# Patient Record
Sex: Female | Born: 1980
Health system: Southern US, Community
[De-identification: ages and names within clinical notes are randomized; demographics above are authoritative.]

## PROBLEM LIST (undated history)

## (undated) HISTORY — PX: APPENDECTOMY: SHX54

## (undated) HISTORY — PX: CYST EXCISION: SHX5701

## (undated) HISTORY — PX: CHOLECYSTECTOMY: SHX55

---

## 2013-03-28 ENCOUNTER — Ambulatory Visit: Payer: Self-pay | Admitting: Family Medicine

## 2013-06-16 ENCOUNTER — Inpatient Hospital Stay: Payer: Self-pay | Admitting: Obstetrics and Gynecology

## 2013-06-16 ENCOUNTER — Observation Stay: Payer: Self-pay | Admitting: Obstetrics and Gynecology

## 2013-06-16 LAB — CBC WITH DIFFERENTIAL/PLATELET
Basophil %: 0.7 %
Eosinophil #: 0.2 10*3/uL (ref 0.0–0.7)
Eosinophil %: 2.5 %
HCT: 33.2 % — ABNORMAL LOW (ref 35.0–47.0)
Lymphocyte #: 1.4 10*3/uL (ref 1.0–3.6)
Lymphocyte %: 17.5 %
MCV: 95 fL (ref 80–100)
Monocyte %: 7.2 %
Neutrophil #: 5.9 10*3/uL (ref 1.4–6.5)
Neutrophil %: 72.1 %
Platelet: 267 10*3/uL (ref 150–440)
RBC: 3.51 10*6/uL — ABNORMAL LOW (ref 3.80–5.20)
RDW: 13.1 % (ref 11.5–14.5)
WBC: 8.2 10*3/uL (ref 3.6–11.0)

## 2013-06-16 LAB — URINALYSIS, COMPLETE
Bacteria: NONE SEEN
Bilirubin,UR: NEGATIVE
Glucose,UR: NEGATIVE mg/dL (ref 0–75)
Ketone: NEGATIVE
Leukocyte Esterase: NEGATIVE
Ph: 7 (ref 4.5–8.0)
Protein: NEGATIVE
Squamous Epithelial: <1

## 2013-06-16 LAB — RUPTURE OF MEMBRANE PLUS: Rom Plus: DETECTED

## 2013-06-18 LAB — CBC WITH DIFFERENTIAL/PLATELET
Basophil %: 0.2 %
Eosinophil #: 0 10*3/uL (ref 0.0–0.7)
Eosinophil %: 0.1 %
HGB: 11.5 g/dL — ABNORMAL LOW (ref 12.0–16.0)
Lymphocyte #: 1.3 10*3/uL (ref 1.0–3.6)
Lymphocyte %: 12.4 %
MCH: 33.1 pg (ref 26.0–34.0)
MCHC: 34.9 g/dL (ref 32.0–36.0)
MCV: 95 fL (ref 80–100)
Monocyte #: 0.6 x10 3/mm (ref 0.2–0.9)
Monocyte %: 5.7 %
Neutrophil #: 8.6 10*3/uL — ABNORMAL HIGH (ref 1.4–6.5)
Neutrophil %: 81.6 %
RBC: 3.47 10*6/uL — ABNORMAL LOW (ref 3.80–5.20)
RDW: 12.8 % (ref 11.5–14.5)

## 2013-06-19 LAB — CBC WITH DIFFERENTIAL/PLATELET
Basophil %: 0.6 %
Eosinophil #: 0.1 10*3/uL (ref 0.0–0.7)
Lymphocyte #: 2.1 10*3/uL (ref 1.0–3.6)
Lymphocyte %: 21.5 %
MCHC: 35.2 g/dL (ref 32.0–36.0)
MCV: 94 fL (ref 80–100)
Monocyte #: 0.9 x10 3/mm (ref 0.2–0.9)
Monocyte %: 8.8 %
Neutrophil #: 6.6 10*3/uL — ABNORMAL HIGH (ref 1.4–6.5)
WBC: 9.7 10*3/uL (ref 3.6–11.0)

## 2013-06-20 LAB — CBC WITH DIFFERENTIAL/PLATELET
Basophil #: 0 10*3/uL (ref 0.0–0.1)
Basophil %: 0.5 %
Eosinophil %: 2.4 %
HCT: 31.5 % — ABNORMAL LOW (ref 35.0–47.0)
HGB: 10.8 g/dL — ABNORMAL LOW (ref 12.0–16.0)
Lymphocyte #: 2.1 10*3/uL (ref 1.0–3.6)
Lymphocyte %: 21.7 %
MCH: 32.6 pg (ref 26.0–34.0)
MCV: 95 fL (ref 80–100)
Monocyte %: 8.8 %
Platelet: 254 10*3/uL (ref 150–440)
RBC: 3.32 10*6/uL — ABNORMAL LOW (ref 3.80–5.20)
WBC: 9.8 10*3/uL (ref 3.6–11.0)

## 2013-06-21 LAB — CBC WITH DIFFERENTIAL/PLATELET
Basophil #: 0.1 10*3/uL (ref 0.0–0.1)
Basophil #: 0.1 10*3/uL (ref 0.0–0.1)
Basophil %: 0.8 %
Eosinophil #: 0.3 10*3/uL (ref 0.0–0.7)
Eosinophil #: 0.3 10*3/uL (ref 0.0–0.7)
Eosinophil %: 2.9 %
HCT: 33.6 % — ABNORMAL LOW (ref 35.0–47.0)
Lymphocyte #: 2.1 10*3/uL (ref 1.0–3.6)
Lymphocyte %: 19 %
Lymphocyte %: 17.6 %
MCH: 32.8 pg (ref 26.0–34.0)
MCH: 32.4 pg (ref 26.0–34.0)
MCHC: 34.6 g/dL (ref 32.0–36.0)
MCV: 94 fL (ref 80–100)
Monocyte #: 1 x10 3/mm — ABNORMAL HIGH (ref 0.2–0.9)
Monocyte %: 9.5 %
Neutrophil #: 7.5 10*3/uL — ABNORMAL HIGH (ref 1.4–6.5)
Neutrophil %: 67.9 %
Neutrophil %: 69.3 %
RBC: 3.63 10*6/uL — ABNORMAL LOW (ref 3.80–5.20)
RDW: 12.6 % (ref 11.5–14.5)
RDW: 12.8 % (ref 11.5–14.5)
WBC: 11.1 10*3/uL — ABNORMAL HIGH (ref 3.6–11.0)

## 2013-06-22 LAB — CBC WITH DIFFERENTIAL/PLATELET
Eosinophil %: 3.3 %
HGB: 12.1 g/dL (ref 12.0–16.0)
Lymphocyte #: 2 10*3/uL (ref 1.0–3.6)
Lymphocyte %: 18 %
Monocyte #: 1 x10 3/mm — ABNORMAL HIGH (ref 0.2–0.9)
Neutrophil #: 7.6 10*3/uL — ABNORMAL HIGH (ref 1.4–6.5)
Neutrophil %: 69.3 %
RDW: 12.6 % (ref 11.5–14.5)
WBC: 10.9 10*3/uL (ref 3.6–11.0)

## 2013-06-23 LAB — CBC WITH DIFFERENTIAL/PLATELET
Basophil #: 0.1 10*3/uL (ref 0.0–0.1)
Basophil %: 0.8 %
Eosinophil %: 4.7 %
HCT: 33.1 % — ABNORMAL LOW (ref 35.0–47.0)
Lymphocyte #: 2.1 10*3/uL (ref 1.0–3.6)
Lymphocyte %: 21.6 %
MCH: 33.1 pg (ref 26.0–34.0)
MCHC: 35.2 g/dL (ref 32.0–36.0)
MCV: 94 fL (ref 80–100)
Monocyte #: 0.9 x10 3/mm (ref 0.2–0.9)
Neutrophil #: 6.1 10*3/uL (ref 1.4–6.5)
Neutrophil %: 63.8 %
Platelet: 268 10*3/uL (ref 150–440)
WBC: 9.6 10*3/uL (ref 3.6–11.0)

## 2013-06-24 LAB — CBC WITH DIFFERENTIAL/PLATELET
Basophil #: 0.1 10*3/uL (ref 0.0–0.1)
Basophil %: 0.9 %
Eosinophil #: 0.4 10*3/uL (ref 0.0–0.7)
Eosinophil %: 4.2 %
HCT: 33.3 % — ABNORMAL LOW (ref 35.0–47.0)
HGB: 11.6 g/dL — ABNORMAL LOW (ref 12.0–16.0)
Lymphocyte #: 2.1 10*3/uL (ref 1.0–3.6)
Lymphocyte %: 22 %
MCH: 32.9 pg (ref 26.0–34.0)
MCV: 95 fL (ref 80–100)
Monocyte #: 0.9 x10 3/mm (ref 0.2–0.9)
Monocyte %: 9.1 %
Neutrophil %: 63.8 %
RDW: 12.5 % (ref 11.5–14.5)
WBC: 9.5 10*3/uL (ref 3.6–11.0)

## 2013-06-25 LAB — CBC WITH DIFFERENTIAL/PLATELET
Basophil #: 0.1 10*3/uL (ref 0.0–0.1)
Basophil %: 0.9 %
Eosinophil #: 0.5 10*3/uL (ref 0.0–0.7)
Eosinophil %: 4.8 %
HCT: 31.9 % — ABNORMAL LOW (ref 35.0–47.0)
HGB: 11.1 g/dL — ABNORMAL LOW (ref 12.0–16.0)
Lymphocyte #: 2.2 10*3/uL (ref 1.0–3.6)
Lymphocyte %: 23.1 %
MCH: 32.7 pg (ref 26.0–34.0)
MCHC: 34.7 g/dL (ref 32.0–36.0)
MCV: 94 fL (ref 80–100)
Monocyte %: 8.3 %
Neutrophil #: 6 10*3/uL (ref 1.4–6.5)
Platelet: 245 10*3/uL (ref 150–440)
RDW: 12.7 % (ref 11.5–14.5)
WBC: 9.6 10*3/uL (ref 3.6–11.0)

## 2013-06-26 LAB — CBC WITH DIFFERENTIAL/PLATELET
Basophil #: 0.1 10*3/uL (ref 0.0–0.1)
Eosinophil %: 3.5 %
HCT: 31.2 % — ABNORMAL LOW (ref 35.0–47.0)
HGB: 11.1 g/dL — ABNORMAL LOW (ref 12.0–16.0)
Lymphocyte #: 2 10*3/uL (ref 1.0–3.6)
Lymphocyte %: 20.9 %
MCH: 33.1 pg (ref 26.0–34.0)
MCHC: 35.4 g/dL (ref 32.0–36.0)
MCV: 94 fL (ref 80–100)
Monocyte #: 0.7 x10 3/mm (ref 0.2–0.9)
Monocyte %: 7.7 %
Platelet: 249 10*3/uL (ref 150–440)
RDW: 12.4 % (ref 11.5–14.5)

## 2013-06-27 LAB — CBC WITH DIFFERENTIAL/PLATELET
Basophil #: 0.1 10*3/uL (ref 0.0–0.1)
Eosinophil #: 0.3 10*3/uL (ref 0.0–0.7)
Eosinophil %: 3.5 %
HGB: 11.4 g/dL — ABNORMAL LOW (ref 12.0–16.0)
Lymphocyte #: 1.8 10*3/uL (ref 1.0–3.6)
Lymphocyte %: 19.1 %
MCV: 94 fL (ref 80–100)
Monocyte %: 8.5 %
Neutrophil %: 67.8 %
Platelet: 230 10*3/uL (ref 150–440)
RBC: 3.42 10*6/uL — ABNORMAL LOW (ref 3.80–5.20)
RDW: 12.6 % (ref 11.5–14.5)
WBC: 9.6 10*3/uL (ref 3.6–11.0)

## 2013-06-29 LAB — CBC WITH DIFFERENTIAL/PLATELET
Basophil %: 0.2 %
HGB: 10.9 g/dL — ABNORMAL LOW (ref 12.0–16.0)
Lymphocyte %: 8 %
MCH: 32.5 pg (ref 26.0–34.0)
MCHC: 34.6 g/dL (ref 32.0–36.0)
MCV: 94 fL (ref 80–100)
Monocyte %: 6.6 %
Neutrophil #: 20.7 10*3/uL — ABNORMAL HIGH (ref 1.4–6.5)
RDW: 12.7 % (ref 11.5–14.5)

## 2013-06-30 LAB — CBC WITH DIFFERENTIAL/PLATELET
Basophil #: 0.1 10*3/uL (ref 0.0–0.1)
Eosinophil %: 2.9 %
Lymphocyte #: 3.2 10*3/uL (ref 1.0–3.6)
Lymphocyte %: 27.3 %
MCHC: 34.5 g/dL (ref 32.0–36.0)
Monocyte #: 0.9 x10 3/mm (ref 0.2–0.9)
Monocyte %: 7.9 %
Neutrophil %: 61.2 %
Platelet: 201 10*3/uL (ref 150–440)
RBC: 3.31 10*6/uL — ABNORMAL LOW (ref 3.80–5.20)

## 2013-07-02 LAB — PATHOLOGY REPORT

## 2015-01-10 NOTE — Consult Note (Signed)
   Gravida 3   Para 2   Term Deliveries 2   Preterm Deliveries 0   Abortions 0   Living Children 2   Final EDD (dd-[id number]) 11-Aug-2013   Blood Type (Maternal) O positive   Antibody Screen Results (Maternal) negative   HIV Results (Maternal) negative   Gonorrhea Results (Maternal) negative   Chlamydia Results (Maternal) negative   Hepatitis C Culture (Maternal) unknown   Herpes Results (Maternal) n/a   VDRL/RPR/Syphilis Results (Maternal) negative   Varicella Titer Results (Maternal) Positive   Rubella Results (Maternal) immune   Hepatitis B Surface Antigen Results (Maternal) negative   Group B Strep Results Maternal (Result >5wks must be treated as unknown) unknown/result > 5 weeks ago  just sent swab on admission 06/16/13    Additional Comments Asked by Dr. Feliberto GottronSchermerhorn to provide prenatal consultation for this 34 y.o  G3 P2 mother who has presented with PROM at 32 wks.  She is being treated with BMZ, Magnesium sulfate and ampicillin, with plans to defer delivery for 2 - days unless there are signs of infection or other complications.  Talked with patient and FOB about NICU attendance at delivery and possible need for resuscitation, and about usual expectations for preterm infants at 32+ wks, including possible respiratory distress requiring O2 or respiratory support, hypothermia, feeding problems, and need for IV support.  Presented uncertain LOS, possibly 3 - 4 wks or until 36 - [redacted] wks EGA.  Discussed desirability of feeding mother's milk - she plans to pump and breast feed ASAP after delivery.  Patient and FOB were attentive and asked appropriate questions and expressed appreciation for my input.  Thank you for consulting Neonatology  JWimmer, MD  face-to-face time 20 minutes   Electronic Signatures: Serita GritWimmer, Delia Sitar E (MD)  (Signed 27-Sep-14 14:35)  Authored: PREGNANCY and LABOR, ADDITIONAL COMMENTS   Last Updated: 27-Sep-14 14:35 by Serita GritWimmer, Doryan Bahl E (MD)

## 2015-01-28 NOTE — H&P (Signed)
L&D Evaluation:  History:  HPI [age] yo G3P2 at 32+0 Weeks  c/o leakage of fluid since yesterday . + Rom plus today . + bloody/ pinkish fluid mixed in . No CTX . 2 prior term deliveries   Patient's Medical History No Chronic Illness   Patient's Surgical History none   Medications Pre Natal Vitamins   Allergies NKDA   Social History none   Family History Non-Contributory   ROS:  ROS All systems were reviewed.  HEENT, CNS, GI, GU, Respiratory, CV, Renal and Musculoskeletal systems were found to be normal.   Exam:  Vital Signs 110/64   General no apparent distress   Mental Status clear   Chest clear   Heart normal sinus rhythm   Abdomen gravid, non-tender   Estimated Fetal Weight Average for gestational age   Fetal Position vtx by U/s   Back no CVAT   Pelvic closed / long Vtx blt   Mebranes Ruptured   Description blood tinged   FHT normal rate with no decels   Fetal Heart Rate 150   Ucx absent   Skin dry   Other u/s by me  AFI = 5.8 cm VTX   Impression:  Impression PPROM, 32 + 0 weeks   Plan:  Plan admission. Start magnesium sulfate 4 gm bolus and 2 gm/ hr for neuroprotective and ctx suppression for betamethasone effectiveness. Start ampicillin 2 gm bolus and 1 gm q 4 hrs. neonatal consult in the event of  delivery.   Electronic Signatures: Schermerhorn, Ihor Austinhomas J (MD)  (Signed 27-Sep-14 11:43)  Authored: L&D Evaluation   Last Updated: 27-Sep-14 11:43 by Suzy BouchardSchermerhorn, Thomas J (MD)

## 2016-04-18 ENCOUNTER — Encounter: Payer: Self-pay | Admitting: Emergency Medicine

## 2016-04-18 ENCOUNTER — Emergency Department
Admission: EM | Admit: 2016-04-18 | Discharge: 2016-04-18 | Disposition: A | Discharge status: Home / Self Care | Payer: Self-pay | Attending: Emergency Medicine | Admitting: Emergency Medicine

## 2016-04-18 ENCOUNTER — Emergency Department: Payer: Self-pay

## 2016-04-18 DIAGNOSIS — Y990 Civilian activity done for income or pay: Secondary | ICD-10-CM | POA: Insufficient documentation

## 2016-04-18 DIAGNOSIS — S5002XA Contusion of left elbow, initial encounter: Secondary | ICD-10-CM | POA: Insufficient documentation

## 2016-04-18 DIAGNOSIS — S50312A Abrasion of left elbow, initial encounter: Secondary | ICD-10-CM

## 2016-04-18 DIAGNOSIS — M7918 Myalgia, other site: Secondary | ICD-10-CM

## 2016-04-18 DIAGNOSIS — Y92488 Other paved roadways as the place of occurrence of the external cause: Secondary | ICD-10-CM | POA: Insufficient documentation

## 2016-04-18 DIAGNOSIS — S0101XA Laceration without foreign body of scalp, initial encounter: Secondary | ICD-10-CM | POA: Insufficient documentation

## 2016-04-18 DIAGNOSIS — Y9389 Activity, other specified: Secondary | ICD-10-CM | POA: Insufficient documentation

## 2016-04-18 DIAGNOSIS — S0093XA Contusion of unspecified part of head, initial encounter: Secondary | ICD-10-CM

## 2016-04-18 DIAGNOSIS — W1800XA Striking against unspecified object with subsequent fall, initial encounter: Secondary | ICD-10-CM | POA: Insufficient documentation

## 2016-04-18 MED ORDER — IBUPROFEN 800 MG PO TABS: 800.0000 mg | ORAL_TABLET | Freq: Three times a day (TID) | ORAL | 0 refills | Status: DC | PRN

## 2016-04-18 MED ORDER — BUTALBITAL-APAP-CAFFEINE 50-325-40 MG PO TABS
1.0000 | ORAL_TABLET | Freq: Four times a day (QID) | ORAL | 0 refills | Status: DC | PRN
Start: 2016-04-18 — End: 2019-11-02

## 2016-04-18 MED ORDER — BACITRACIN ZINC 500 UNIT/GM EX OINT
TOPICAL_OINTMENT | Freq: Once | CUTANEOUS | Status: AC
  Administered 2016-04-18: 12:00:00 via TOPICAL
  Filled 2016-04-18: qty 0.9

## 2016-04-18 MED ORDER — BUTALBITAL-APAP-CAFFEINE 50-325-40 MG PO TABS
1.0000 | ORAL_TABLET | Freq: Once | ORAL | Status: AC
  Administered 2016-04-18: 1 via ORAL
  Filled 2016-04-18: qty 1
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
## 2016-04-18 NOTE — ED Notes (Signed)
See triage note  States she fell hit head  Small laceration/abrasion to scalp and  To elbow  Denies any LOC
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
## 2016-04-18 NOTE — ED Notes (Signed)
Pt informed to return if any life threatening symptoms occur.  

## 2016-04-30 ENCOUNTER — Emergency Department
Admission: EM | Admit: 2016-04-30 | Discharge: 2016-04-30 | Disposition: A | Discharge status: Home / Self Care | Payer: Self-pay | Attending: Emergency Medicine | Admitting: Emergency Medicine

## 2016-04-30 ENCOUNTER — Encounter: Payer: Self-pay | Admitting: Emergency Medicine

## 2016-04-30 DIAGNOSIS — Z4802 Encounter for removal of sutures: Secondary | ICD-10-CM | POA: Insufficient documentation

## 2016-04-30 DIAGNOSIS — Z791 Long term (current) use of non-steroidal anti-inflammatories (NSAID): Secondary | ICD-10-CM | POA: Insufficient documentation

## 2016-04-30 NOTE — ED Provider Notes (Signed)
[facility] Emergency Department Provider Note  ____________________________________________  Time seen: Approximately 4:23 PM  I have reviewed the triage vital signs and the nursing notes.   HISTORY  Chief Complaint Suture / Staple Removal    HPI Mary Conner is a 35 y.o. female who presents to emergency department for staple removal from the back of her head. Patient states thatareas healing well with no complications. Patient was seen in this department and had 4 staples placed. She reports all staples are still intact. No complaints at this time.   History reviewed. No pertinent past medical history.  There are no active problems to display for this patient.   History reviewed. No pertinent surgical history.  Prior to Admission medications   Medication Sig Start Date End Date Taking? Authorizing Provider  butalbital-acetaminophen-caffeine (FIORICET) 50-325-40 MG tablet Take 1-2 tablets by mouth every 6 (six) hours as needed for headache. 04/18/16   Evangeline Dakinharles M Beers, PA-C  ibuprofen (ADVIL,MOTRIN) 800 MG tablet Take 1 tablet (800 mg total) by mouth every 8 (eight) hours as needed. 04/18/16   Evangeline Dakinharles M Beers, PA-C    Allergies Review of patient's allergies indicates no known allergies.  History reviewed. No pertinent family history.  Social History Social History  Substance Use Topics  . Smoking status: Never Smoker  . Smokeless tobacco: Never Used  . Alcohol use Yes     Comment: occasionally     Review of Systems  Constitutional: No fever/chills Cardiovascular: no chest pain. Respiratory: no cough. No SOB. Gastrointestinal:  No nausea, no vomiting.   Musculoskeletal: Negative for musculoskeletal pain. Skin: Positive for staple laceration to the  left occipital region Neurological: Negative for headaches, focal weakness or numbness. 10-point ROS otherwise negative.  ____________________________________________   PHYSICAL  EXAM:  VITAL SIGNS: ED Triage Vitals  Enc Vitals Group     BP 04/30/16 1613 (!) 143/90     Pulse Rate 04/30/16 1613 83     Resp 04/30/16 1613 16     Temp 04/30/16 1613 97.8 F (36.6 C)     Temp Source 04/30/16 1613 Oral     SpO2 04/30/16 1613 100 %     Weight 04/30/16 1612 212 lb (96.2 kg)     Height 04/30/16 1612 5\' 4"  (1.626 m)     Head Circumference --      Peak Flow --      Pain Score --      Pain Loc --      Pain Edu? --      Excl. in GC? --      Constitutional: Alert and oriented. Well appearing and in no acute distress. Eyes: Conjunctivae are normal. PERRL. EOMI. Head: Stapled laceration noted to the left posterior occipital region. 4 staples are in place. No dehiscence noted. No erythema or edema to area. No signs of infection. Area is nontender to palpation. Cardiovascular: Normal rate, regular rhythm. Normal S1 and S2.  Good peripheral circulation. Respiratory: Normal respiratory effort without tachypnea or retractions. Lungs CTAB. Good air entry to the bases with no decreased or absent breath sounds. Musculoskeletal: Full range of motion to all extremities. No gross deformities appreciated. Neurologic:  Normal speech and language. No gross focal neurologic deficits are appreciated.  Skin:  Skin is warm, dry and intact. No rash noted. Psychiatric: Mood and affect are normal. Speech and behavior are normal. Patient exhibits appropriate insight and judgement.   ____________________________________________   LABS (all labs ordered are listed, but only abnormal results  are displayed)  Labs Reviewed - No data to display ____________________________________________  EKG   ____________________________________________  RADIOLOGY   No results found.  ____________________________________________    PROCEDURES  Procedure(s) performed:    Procedures  SUTURE REMOVAL Performed by: Racheal Patches  Consent: Verbal consent obtained. Consent given by:  patient Required items: required blood products, implants, devices, and special equipment available Time out: Immediately prior to procedure a "time out" was called to verify the correct patient, procedure, equipment, support staff and site/side marked as required.  Location: Left occipital skull  Wound Appearance: clean  Sutures/Staples Removed: 4  Patient tolerance: Patient tolerated the procedure well with no immediate complications.     Medications - No data to display   ____________________________________________   INITIAL IMPRESSION / ASSESSMENT AND PLAN / ED COURSE  Pertinent labs & imaging results that were available during my care of the patient were reviewed by me and considered in my medical decision making (see chart for details).  Clinical Course    Patient's diagnosis is consistent with Encounter for staple removal. No signs of infection or dehiscence. Wound has healed well. All staples are removed.. Patient will follow-up with primary care as needed Patient is given ED precautions to return to the ED for any worsening or new symptoms.     ____________________________________________  FINAL CLINICAL IMPRESSION(S) / ED DIAGNOSES  Final diagnoses:  Encounter for staple removal      NEW MEDICATIONS STARTED DURING THIS VISIT:  New Prescriptions   No medications on file        This chart was dictated using voice recognition software/Dragon. Despite best efforts to proofread, errors can occur which can change the meaning. Any change was purely unintentional.    Racheal Patches, PA-C 04/30/16 1628    Myrna Blazer, MD 04/30/16 2107

## 2016-04-30 NOTE — ED Triage Notes (Signed)
Pt has staples placed 12 days ago. Wants to see if they can be removed.

## 2017-09-12 IMAGING — CT CT HEAD W/O CM
3 series · 15 of 45 positions shown, 18 images · non-contrast
Comparison: None.

CLINICAL DATA: Headache and left ear pain status post fall with
laceration to the posterior scalp.

EXAM:
CT HEAD WITHOUT CONTRAST
TECHNIQUE: Contiguous axial images were obtained from the base of the skull
through the vertex without intravenous contrast.

[Series 2: head wo · axial · 0.39mm/px · z∈[-172,-56]mm · 9 of 28 slices shown, 12 images]
[im 3/28  brain]
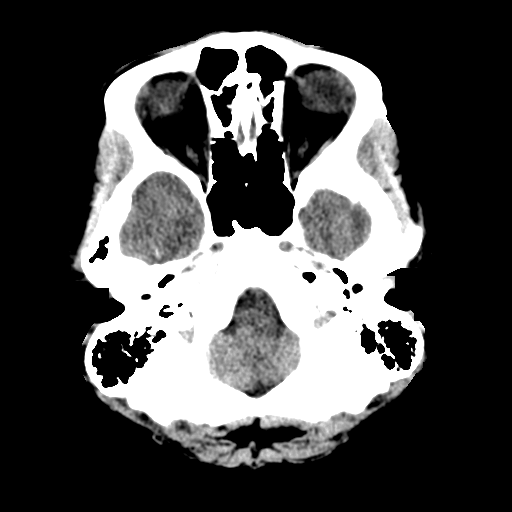
[im 3/28  bone]
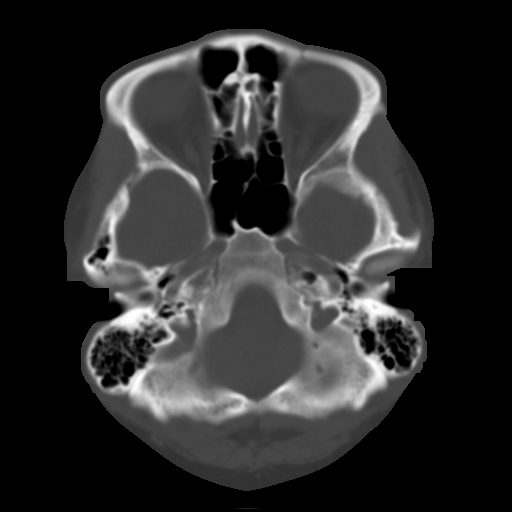
[im 6/28  brain]
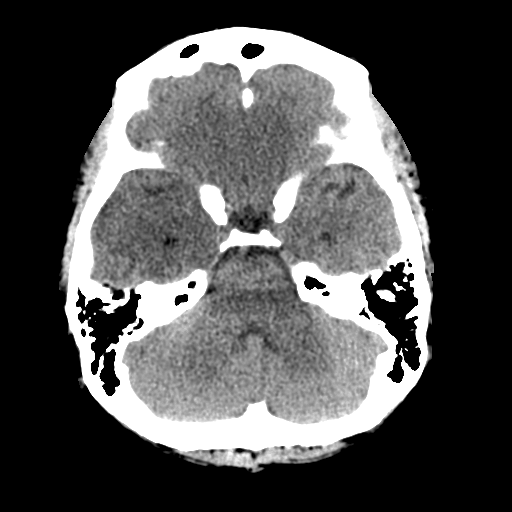
[im 9/28  brain]
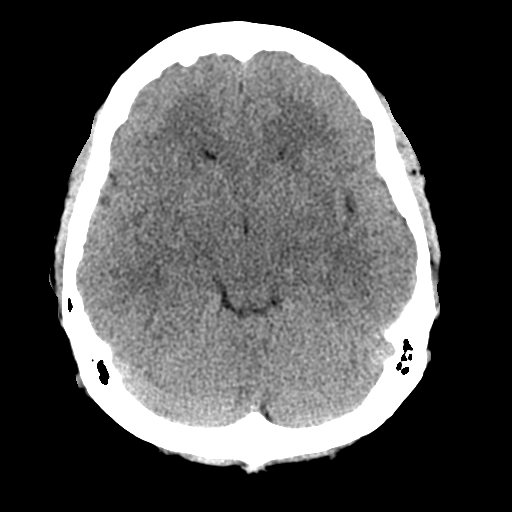
[im 12/28  brain]
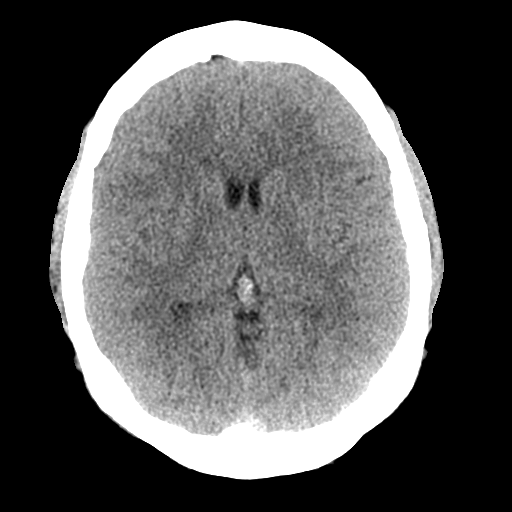
[im 15/28  brain]
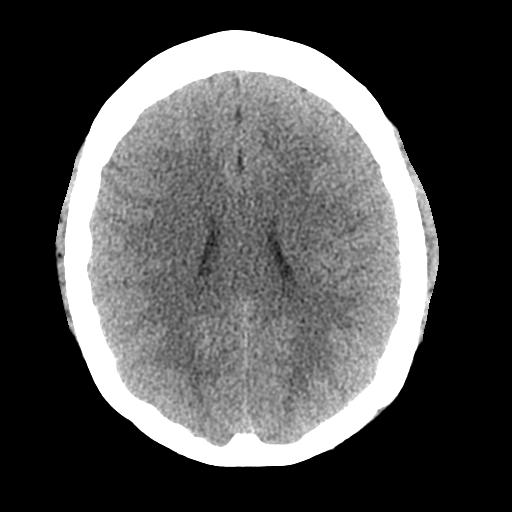
[im 15/28  bone]
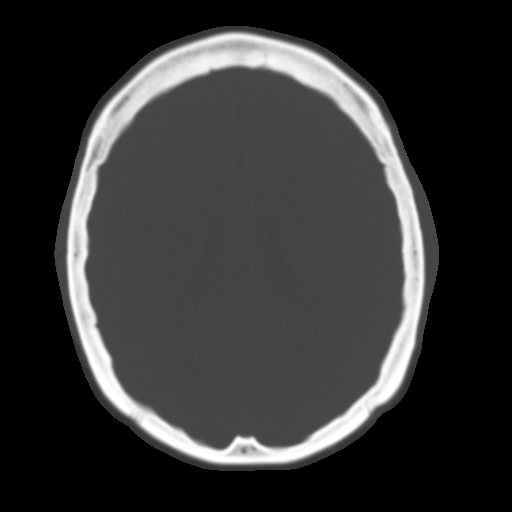
[im 17/28  brain]
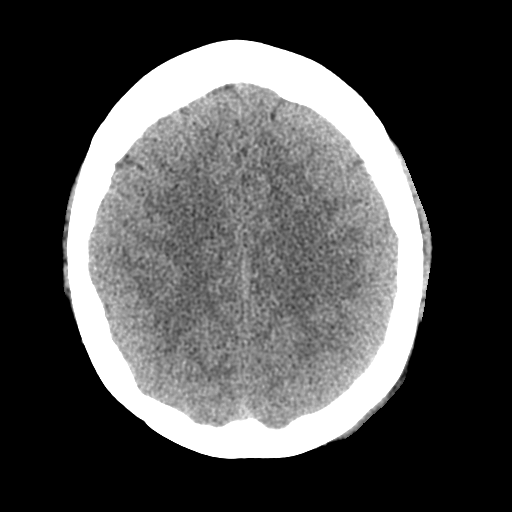
[im 20/28  brain]
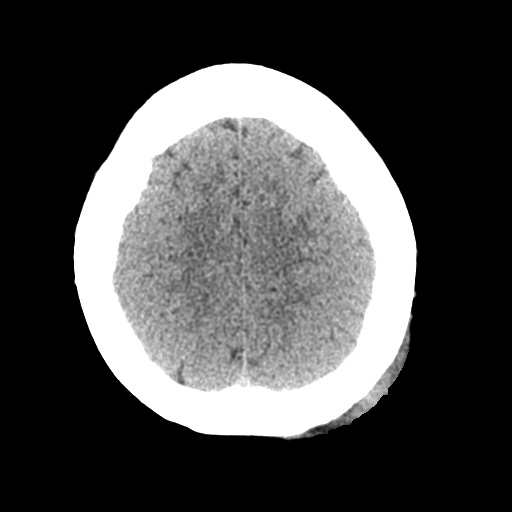
[im 23/28  brain]
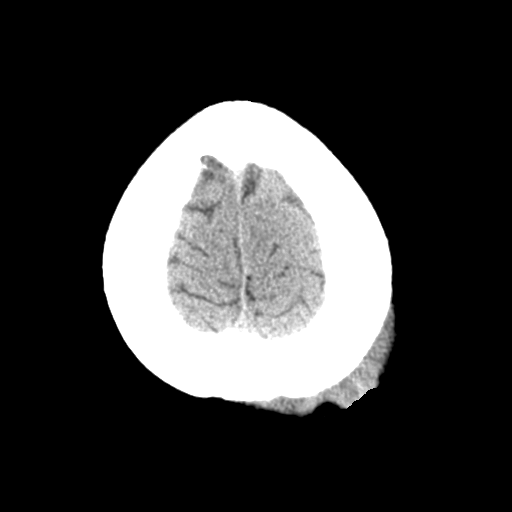
[im 26/28  brain]
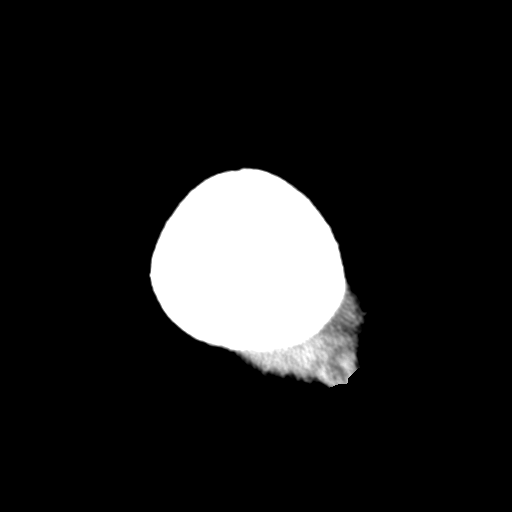
[im 26/28  bone]
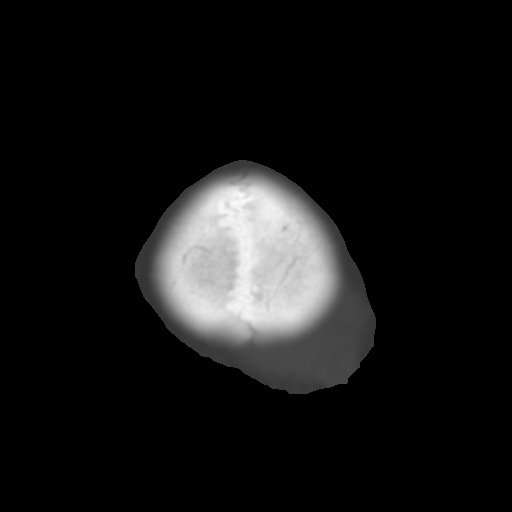

[Series 4: coronal soft tissue · coronal · 0.28mm/px · 3 of 66 slices shown]
[im 22/66  brain]
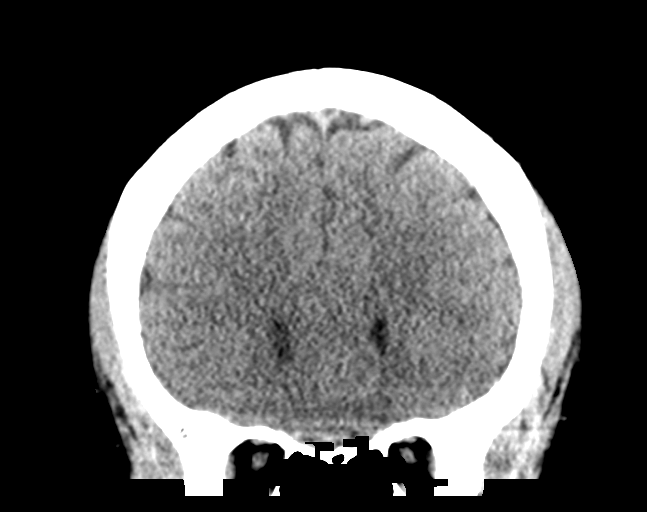
[im 29/66  brain]
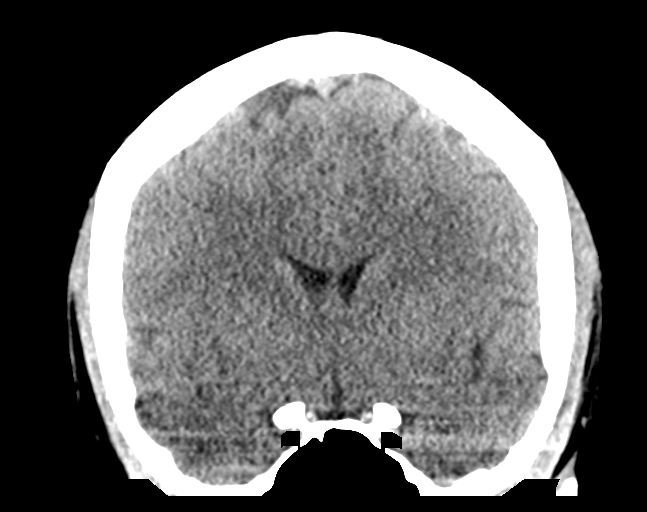
[im 37/66  brain]
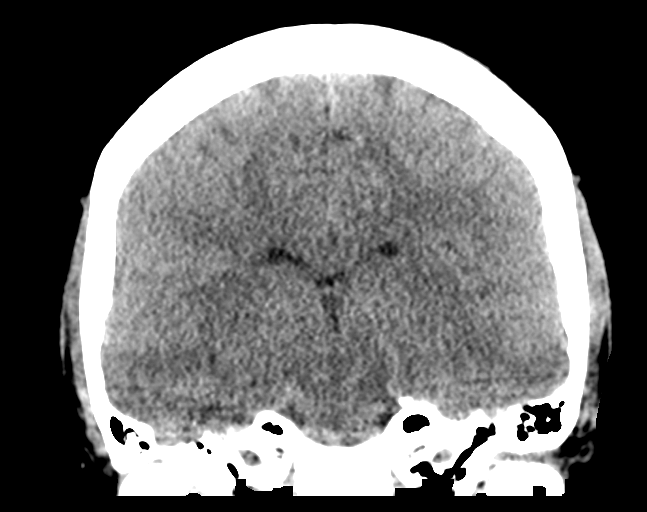

[Series 5: sagittal soft tissue · sagittal · 0.29mm/px · 3 of 61 slices shown]
[im 21/61  brain]
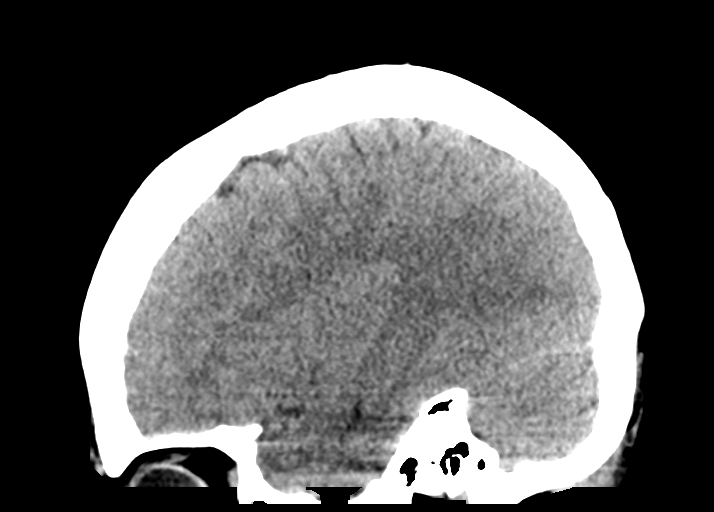
[im 31/61  brain]
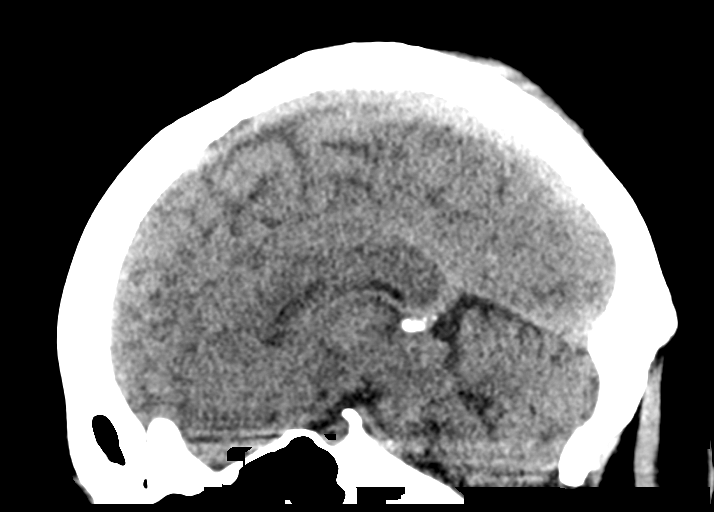
[im 41/61  brain]
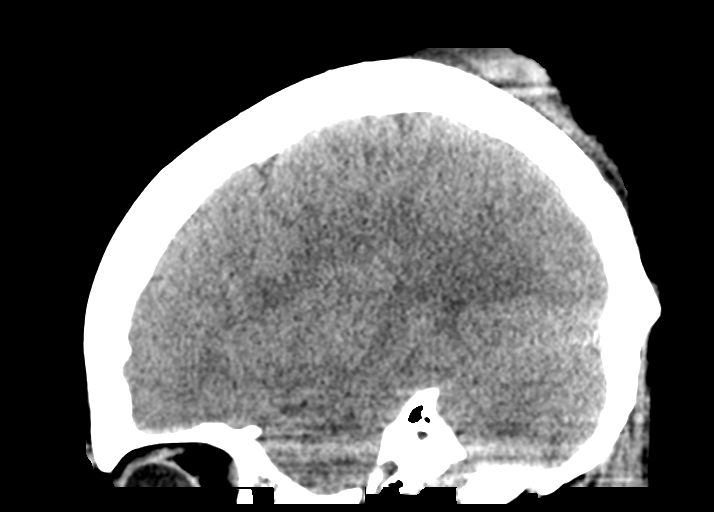

[15 of 45 positions shown; findings below may reference images not displayed]

FINDINGS: Brain: No evidence of acute infarction, hemorrhage, extra-axial
collection, ventriculomegaly, or mass effect.

Vascular: No hyperdense vessel or unexpected calcification.

Skull: Negative for fracture or focal lesion.

Sinuses/Orbits: No acute findings.

Other: Large left parietal scalp hematoma.
IMPRESSION: No acute intracranial abnormality.

## 2019-11-02 ENCOUNTER — Encounter: Payer: Self-pay | Admitting: Family

## 2019-11-02 ENCOUNTER — Other Ambulatory Visit: Payer: Self-pay

## 2019-11-02 ENCOUNTER — Ambulatory Visit (INDEPENDENT_AMBULATORY_CARE_PROVIDER_SITE_OTHER): Payer: No Typology Code available for payment source | Admitting: Family

## 2019-11-02 VITALS — Ht 65.0 in | Wt 244.0 lb

## 2019-11-02 DIAGNOSIS — Z6838 Body mass index (BMI) 38.0-38.9, adult: Secondary | ICD-10-CM | POA: Insufficient documentation

## 2019-11-02 DIAGNOSIS — B351 Tinea unguium: Secondary | ICD-10-CM | POA: Diagnosis not present

## 2019-11-02 DIAGNOSIS — Z6837 Body mass index (BMI) 37.0-37.9, adult: Secondary | ICD-10-CM | POA: Insufficient documentation

## 2019-11-02 DIAGNOSIS — Z7689 Persons encountering health services in other specified circumstances: Secondary | ICD-10-CM | POA: Insufficient documentation

## 2019-11-02 DIAGNOSIS — E669 Obesity, unspecified: Secondary | ICD-10-CM | POA: Diagnosis not present

## 2019-11-02 HISTORY — DX: Tinea unguium: B35.1

## 2019-11-02 NOTE — Patient Instructions (Addendum)
Referral to [facility] podiatry.   Nice to meet you today !   This is  Dr. Melina Schools  example of a  "Low GI"  Diet:  It will allow you to lose 4 to 8  lbs  per month if you follow it carefully.  Your goal with exercise is a minimum of 30 minutes of aerobic exercise 5 days per week (Walking does not count once it becomes easy!)    All of the foods can be found at grocery stores and in bulk at [employer].  The Atkins protein bars and shakes are available in more varieties at Target, WalMart and [employer]'s Foods.     7 AM Breakfast:  Choose from the following:  Low carbohydrate Protein  Shakes (I recommend the  Premier Protein chocolate shakes,  EAS AdvantEdge "Carb Control" shakes  Or the Atkins shakes all are under 3 net carbs)     a scrambled egg/bacon/cheese burrito made with Mission's "carb balance" whole wheat tortilla  (about 10 net carbs )  Medical laboratory scientific officer (basically a quiche without the pastry crust) that is eaten cold and very convenient way to get your eggs.  8 carbs)  If you make your own protein shakes, avoid bananas and pineapple,  And use low carb greek yogurt or original /unsweetened almond or soy milk    Avoid cereal and bananas, oatmeal and cream of wheat and grits. They are loaded with carbohydrates!   10 AM: high protein snack:  Protein bar by Atkins (the snack size, under 200 cal, usually < 6 net carbs).    A stick of cheese:  Around 1 carb,  100 cal     Dannon Light n Fit [place] Yogurt  (80 cal, 8 carbs)  Other so called "protein bars" and Greek yogurts tend to be loaded with carbohydrates.  Remember, in food advertising, the word "energy" is synonymous for " carbohydrate."  Lunch:   A Sandwich using the bread choices listed, Can use any  Eggs,  lunchmeat, grilled meat or canned tuna), avocado, regular mayo/mustard  and cheese.  A Salad using blue cheese, ranch,  Goddess or vinagrette,  Avoid taco shells, croutons or "confetti" and no "candied  nuts" but regular nuts OK.   No pretzels, nabs  or chips.  Pickles and miniature sweet peppers are a good low carb alternative that provide a "crunch"  The bread is the only source of carbohydrate in a sandwich and  can be decreased by trying some of the attached alternatives to traditional loaf bread   Avoid "Low fat dressings, as well as Reyne Dumas and [employer] dressings They are loaded with sugar!   3 PM/ Mid day  Snack:  Consider  1 ounce of  almonds, walnuts, pistachios, pecans, peanuts,  Macadamia nuts or a nut medley.  Avoid "granola and granola bars "  Mixed nuts are ok in moderation as long as there are no raisins,  cranberries or dried fruit.   KIND bars are OK if you get the low glycemic index variety   Try the prosciutto/mozzarella cheese sticks by Fiorruci  In deli /backery section   High protein      6 PM  Dinner:     Meat/fowl/fish with a green salad, and either broccoli, cauliflower, green beans, spinach, brussel sprouts or  Lima beans. DO NOT BREAD THE PROTEIN!!      There is a low carb pasta by Dreamfield's that is acceptable and tastes great: only  5 digestible carbs/serving.( All grocery stores but BJs carry it ) Several ready made meals are available low carb:   Try Michel Angelo's chicken piccata or chicken or eggplant parm over low carb pasta.(Lowes and BJs)   Marjory Lies Sanchez's "Carnitas" (pulled pork, no sauce,  0 carbs) or his beef pot roast to make a dinner burrito (at [employer]J's)  Pesto over low carb pasta (bj's sells a good quality pesto in the center refrigerated section of the deli   Try satueeing  Cheral Marker with mushroooms as a good side   Green Giant makes a mashed cauliflower that tastes like mashed potatoes  Whole wheat pasta is still full of digestible carbs and  Not as low in glycemic index as Dreamfield's.   Brown rice is still rice,  So skip the rice and noodles if you eat [place] or [place] (or at least limit to 1/2 cup)  9 PM snack :   Breyer's "low carb"  fudgsicle or  ice cream bar (Carb Smart line), or  Weight Watcher's ice cream bar , or another "no sugar added" ice cream;  a serving of fresh berries/cherries with whipped cream   Cheese or DANNON'S LlGHT N FIT GREEK YOGURT  8 ounces of [place] unsweetened almond/cococunut milk    Treat yourself to a parfait made with whipped cream blueberiies, walnuts and vanilla greek yogurt  Avoid bananas, pineapple, grapes  and watermelon on a regular basis because they are high in sugar.  THINK OF THEM AS DESSERT  Remember that snack Substitutions should be less than 10 NET carbs per serving and meals < 20 carbs. Remember to subtract fiber grams to get the "net carbs."  @TULLOBREADPACKAGE @

## 2019-11-02 NOTE — Assessment & Plan Note (Signed)
Reviewed past medical history with patient today.  She is very overdue for preventative care.  Advised her to schedule screening labs and CPE, Pap.

## 2019-11-02 NOTE — Assessment & Plan Note (Signed)
Long discussion in regards to her concerns of obesity.  We discussed her eating patterns in particular ,soda and sweets.  Advised her that this is where her action is to start.  She politely declines referral to nutrition at this time.  She would like to see Orlene Plum.  I advised her against phentermine as this has not been long-term effective for patient, and her weight, per patient today, has been going up and down, up and down on this medication which I advised is very unhealthy.  I did not prescribe phentermine so have discussed with patient to let me know if she decides to continue to take this medication

## 2019-11-02 NOTE — Progress Notes (Signed)
Virtual Visit via Video Note  I connected with@  on 11/02/19 at  9:00 AM EST by a video enabled telemedicine application and verified that I am speaking with the correct person using two identifiers.  Location patient: home Location provider:work  Persons participating in the virtual visit: patient, provider  I discussed the limitations of evaluation and management by telemedicine and the availability of in person appointments. The patient expressed understanding and agreed to proceed.   HPI: Establish care, new  Patient Concern for her weight, and struggle to loose weight over the years. Has been going to bariatric clinic in [place], [place].Had been taking medication, phentermine since 2013, off and on. Weight comes back on.    Weight is 244lb, prior had been 250lb.  Exercise 1-2 per week , 20 minutes,  treadmill. Eats 'a lot of pastries, soda, junk food.' Breakfast: toast & butter, coffee Lunch: tacos, grilled chicken Dinner: chicken soup  No cp, sob. Two nails on each foot which 'has fungal infection.'Nail bed is thick, discolored. Tried OTC without improvement.  ROS: See pertinent positives and negatives per HPI.  History reviewed. No pertinent past medical history.  History reviewed. No pertinent surgical history.  History reviewed. No pertinent family history.  SOCIAL HX: non smoker   Current Outpatient Medications:  .  phentermine 37.5 MG capsule, Take 37.5 mg by mouth every morning., Disp: , Rfl:   EXAM:  VITALS per patient if applicable: Body mass index is 40.6 kg/m.  GENERAL: alert, oriented, appears well and in no acute distress  HEENT: atraumatic, conjunttiva clear, no obvious abnormalities on inspection of external nose and ears  NECK: normal movements of the head and neck  LUNGS: on inspection no signs of respiratory distress, breathing rate appears normal, no obvious gross SOB, gasping or wheezing  CV: no obvious cyanosis  MS: moves all visible  extremities without noticeable abnormality  PSYCH/NEURO: pleasant and cooperative, no obvious depression or anxiety, speech and thought processing grossly intact  ASSESSMENT AND PLAN:  Discussed the following assessment and plan:  Encounter to establish care - Plan: CBC with Differential/Platelet, Comprehensive metabolic panel, Hemoglobin A1c, Lipid panel, TSH, VITAMIN D 25 Hydroxy (Vit-D Deficiency, Fractures), HIV Antibody (routine testing w rflx)  Obesity (BMI 30-39.9) - Plan: Amb Ref to Medical Weight Management  Nail fungus - Plan: Ambulatory referral to Podiatry Problem List Items Addressed This Visit      Musculoskeletal and Integument   Nail fungus    Discussed with patient this symptom is most appropriate if  treated by podiatry.  Referral has been placed.       Relevant Orders   Ambulatory referral to Podiatry     Other   Encounter to establish care - Primary    Reviewed past medical history with patient today.  She is very overdue for preventative care.  Advised her to schedule screening labs and CPE, Pap.      Relevant Orders   CBC with Differential/Platelet   Comprehensive metabolic panel   Hemoglobin A1c   Lipid panel   TSH   VITAMIN D 25 Hydroxy (Vit-D Deficiency, Fractures)   HIV Antibody (routine testing w rflx)   Obesity (BMI 30-39.9)    Long discussion in regards to her concerns of obesity.  We discussed her eating patterns in particular ,soda and sweets.  Advised her that this is where her action is to start.  She politely declines referral to nutrition at this time.  She would like to see Orlene Plum.  I advised her against phentermine as this has not been long-term effective for patient, and her weight, per patient today, has been going up and down, up and down on this medication which I advised is very unhealthy.  I did not prescribe phentermine so have discussed with patient to let me know if she decides to continue to take this medication       Relevant Medications   phentermine 37.5 MG capsule   Other Relevant Orders   Amb Ref to Medical Weight Management      -we discussed possible serious and likely etiologies, options for evaluation and workup, limitations of telemedicine visit vs in person visit, treatment, treatment risks and precautions. Pt prefers to treat via telemedicine empirically rather then risking or undertaking an in person visit at this moment. Patient agrees to seek prompt in person care if worsening, new symptoms arise, or if is not improving with treatment.   I discussed the assessment and treatment plan with the patient. The patient was provided an opportunity to ask questions and all were answered. The patient agreed with the plan and demonstrated an understanding of the instructions.   The patient was advised to call back or seek an in-person evaluation if the symptoms worsen or if the condition fails to improve as anticipated.   Mable Paris, FNP

## 2019-11-02 NOTE — Assessment & Plan Note (Signed)
Discussed with patient this symptom is most appropriate if  treated by podiatry.  Referral has been placed.

## 2019-11-15 ENCOUNTER — Ambulatory Visit (INDEPENDENT_AMBULATORY_CARE_PROVIDER_SITE_OTHER): Payer: No Typology Code available for payment source | Admitting: Podiatry

## 2019-11-15 ENCOUNTER — Other Ambulatory Visit: Payer: Self-pay

## 2019-11-15 DIAGNOSIS — B351 Tinea unguium: Secondary | ICD-10-CM

## 2019-11-15 NOTE — Progress Notes (Signed)
Printed and mailed

## 2019-11-16 ENCOUNTER — Encounter: Payer: Self-pay | Admitting: Podiatry

## 2019-11-16 NOTE — Progress Notes (Signed)
  Subjective:  Patient ID: Mary Conner, female    DOB: [birth year],  MRN: [id number]  Chief Complaint  Patient presents with  . Nail Problem    pt has bil toenail fungus of both of the great toenails, pt states that she has no pain in the big toenails, but it has been going on for about 3-4 years. pt has tried self debriedement, but not much has helped.    39 y.o. female presents with the above complaint.  Patient presents with complaints of bilateral hallux toenail fungus that has been causing her some pain as well as cosmetic problem.  Patient states that they have been going for 3 to 4 years of progressive gotten worse.  There is mild pain on palpation.  Patient has tried some self debridement and has tried over-the-counter agents to help decrease the fungal load as well as the growth.  Patient states that nothing has helped.  She would like to know if there is any prescription medication that could address this.  She denies any other acute complaints.   Review of Systems: Negative except as noted in the HPI. Denies N/V/F/Ch.  No past medical history on file.  Current Outpatient Medications:  .  phentermine 37.5 MG capsule, Take 37.5 mg by mouth every morning., Disp: , Rfl:   Social History   Tobacco Use  Smoking Status Never Smoker  Smokeless Tobacco Never Used    No Known Allergies Objective:  There were no vitals filed for this visit. There is no height or weight on file to calculate BMI. Constitutional Well developed. Well nourished.  Vascular Dorsalis pedis pulses palpable bilaterally. Posterior tibial pulses palpable bilaterally. Capillary refill normal to all digits.  No cyanosis or clubbing noted. Pedal hair growth normal.  Neurologic Normal speech. Oriented to person, place, and time. Epicritic sensation to light touch grossly present bilaterally.  Dermatologic  thickened elongated mycotic dystrophic yellowish toenails x2 bilateral hallux.  Mild pain on  palpation.  Rest of the toenails and digits are within normal limits No open wounds. No skin lesions.  Orthopedic:  Manual muscle testing 5 out of 5 bilaterally.  Mild hammertoe contractures bilaterally   Radiographs: None Assessment:   1. Nail fungus   2. Onychomycosis due to dermatophyte    Plan:  Patient was evaluated and treated and all questions answered.  Bilateral hallux onychomycosis -Educated the patient on the etiology of onychomycosis and various treatment options associated with improving the fungal load.  I explained to the patient that there is 3 treatment options available to treat the onychomycosis including topical, p.o., laser treatment.  Patient elected to undergo p.o. options with Lamisil/terbinafine therapy.  In order for me to start the medication therapy, I explained to the patient the importance of evaluating the liver and obtaining the liver function test.  Once the liver function test comes back normal I will start him on 28-month course of Lamisil therapy.  Patient understood all risk and would like to proceed with Lamisil therapy.  I have asked the patient to immediately stop the Lamisil therapy if she has any reactions to it and call the office or go to the emergency room right away.  Patient states understanding   No follow-ups on file.

## 2019-11-29 LAB — HEPATIC FUNCTION PANEL
ALT: 21 IU/L (ref 0–32)
AST: 19 IU/L (ref 0–40)
Albumin: 4.3 g/dL (ref 3.8–4.8)
Alkaline Phosphatase: 91 IU/L (ref 39–117)
Bilirubin Total: 0.3 mg/dL (ref 0.0–1.2)
Bilirubin, Direct: 0.09 mg/dL (ref 0.00–0.40)
Total Protein: 7 g/dL (ref 6.0–8.5)

## 2019-11-29 MED ORDER — TERBINAFINE HCL 250 MG PO TABS: 250.0000 mg | ORAL_TABLET | Freq: Every day | ORAL | 0 refills | Status: DC

## 2019-11-29 NOTE — Addendum Note (Signed)
Addended by: Nicholes Rough on: 11/29/2019 06:59 AM   Modules accepted: Orders

## 2020-01-11 ENCOUNTER — Ambulatory Visit: Payer: No Typology Code available for payment source | Admitting: Nurse Practitioner

## 2020-01-24 ENCOUNTER — Other Ambulatory Visit (INDEPENDENT_AMBULATORY_CARE_PROVIDER_SITE_OTHER): Payer: No Typology Code available for payment source

## 2020-01-24 ENCOUNTER — Other Ambulatory Visit: Payer: Self-pay

## 2020-01-24 DIAGNOSIS — Z7689 Persons encountering health services in other specified circumstances: Secondary | ICD-10-CM | POA: Diagnosis not present

## 2020-01-24 LAB — COMPREHENSIVE METABOLIC PANEL
ALT: 19 U/L (ref 0–35)
AST: 18 U/L (ref 0–37)
Albumin: 3.8 g/dL (ref 3.5–5.2)
Alkaline Phosphatase: 70 U/L (ref 39–117)
BUN: 14 mg/dL (ref 6–23)
CO2: 29 mEq/L (ref 19–32)
Calcium: 8.8 mg/dL (ref 8.4–10.5)
Chloride: 102 mEq/L (ref 96–112)
Creatinine, Ser: 0.76 mg/dL (ref 0.40–1.20)
GFR: 84.19 mL/min (ref >60.00)
Glucose, Bld: 116 mg/dL — ABNORMAL HIGH (ref 70–99)
Potassium: 3.8 mEq/L (ref 3.5–5.1)
Sodium: 137 mEq/L (ref 135–145)
Total Bilirubin: 0.3 mg/dL (ref 0.2–1.2)
Total Protein: 6.7 g/dL (ref 6.0–8.3)

## 2020-01-24 LAB — CBC WITH DIFFERENTIAL/PLATELET
Basophils Absolute: 0 10*3/uL (ref 0.0–0.1)
Basophils Relative: 0.7 % (ref 0.0–3.0)
Eosinophils Absolute: 0.4 10*3/uL (ref 0.0–0.7)
Eosinophils Relative: 6.6 % — ABNORMAL HIGH (ref 0.0–5.0)
HCT: 35.5 % — ABNORMAL LOW (ref 36.0–46.0)
Hemoglobin: 11.7 g/dL — ABNORMAL LOW (ref 12.0–15.0)
Lymphocytes Relative: 34.6 % (ref 12.0–46.0)
Lymphs Abs: 2.2 10*3/uL (ref 0.7–4.0)
MCHC: 33.1 g/dL (ref 30.0–36.0)
MCV: 90 fl (ref 78.0–100.0)
Monocytes Absolute: 0.8 10*3/uL (ref 0.1–1.0)
Monocytes Relative: 11.8 % (ref 3.0–12.0)
Neutro Abs: 3 10*3/uL (ref 1.4–7.7)
Neutrophils Relative %: 46.3 % (ref 43.0–77.0)
Platelets: 317 10*3/uL (ref 150.0–400.0)
RBC: 3.94 Mil/uL (ref 3.87–5.11)
RDW: 14.9 % (ref 11.5–15.5)
WBC: 6.5 10*3/uL (ref 4.0–10.5)

## 2020-01-24 LAB — LIPID PANEL
Cholesterol: 192 mg/dL (ref 0–200)
HDL: 45.1 mg/dL (ref >39.00)
LDL Cholesterol: 124 mg/dL — ABNORMAL HIGH (ref 0–99)
NonHDL: 146.75
Total CHOL/HDL Ratio: 4
Triglycerides: 113 mg/dL (ref 0.0–149.0)
VLDL: 22.6 mg/dL (ref 0.0–40.0)

## 2020-01-24 LAB — HEMOGLOBIN A1C: Hgb A1c MFr Bld: 5.9 % (ref 4.6–6.5)

## 2020-01-24 LAB — TSH: TSH: 1.5 u[IU]/mL (ref 0.35–4.50)

## 2020-01-24 LAB — VITAMIN D 25 HYDROXY (VIT D DEFICIENCY, FRACTURES): VITD: 15.04 ng/mL — ABNORMAL LOW (ref 30.00–100.00)

## 2020-01-25 LAB — HIV ANTIBODY (ROUTINE TESTING W REFLEX): HIV 1&2 Ab, 4th Generation: NONREACTIVE

## 2020-01-29 ENCOUNTER — Other Ambulatory Visit: Payer: Self-pay | Admitting: Family

## 2020-01-29 ENCOUNTER — Encounter: Payer: Self-pay | Admitting: Family

## 2020-01-29 DIAGNOSIS — D649 Anemia, unspecified: Secondary | ICD-10-CM

## 2020-01-30 ENCOUNTER — Encounter: Payer: No Typology Code available for payment source | Admitting: Family

## 2020-02-12 ENCOUNTER — Encounter: Payer: No Typology Code available for payment source | Admitting: Family

## 2020-02-12 DIAGNOSIS — Z0289 Encounter for other administrative examinations: Secondary | ICD-10-CM

## 2020-03-10 ENCOUNTER — Other Ambulatory Visit (INDEPENDENT_AMBULATORY_CARE_PROVIDER_SITE_OTHER): Payer: No Typology Code available for payment source

## 2020-03-10 ENCOUNTER — Other Ambulatory Visit: Payer: Self-pay

## 2020-03-10 ENCOUNTER — Telehealth: Payer: Self-pay | Admitting: Family

## 2020-03-10 DIAGNOSIS — D649 Anemia, unspecified: Secondary | ICD-10-CM | POA: Diagnosis not present

## 2020-03-10 LAB — CBC WITH DIFFERENTIAL/PLATELET
Basophils Absolute: 0.1 10*3/uL (ref 0.0–0.1)
Basophils Relative: 1.1 % (ref 0.0–3.0)
Eosinophils Absolute: 0.5 10*3/uL (ref 0.0–0.7)
Eosinophils Relative: 5.6 % — ABNORMAL HIGH (ref 0.0–5.0)
HCT: 34.9 % — ABNORMAL LOW (ref 36.0–46.0)
Hemoglobin: 11.8 g/dL — ABNORMAL LOW (ref 12.0–15.0)
Lymphocytes Relative: 27.4 % (ref 12.0–46.0)
Lymphs Abs: 2.6 10*3/uL (ref 0.7–4.0)
MCHC: 33.7 g/dL (ref 30.0–36.0)
MCV: 89.4 fl (ref 78.0–100.0)
Monocytes Absolute: 0.9 10*3/uL (ref 0.1–1.0)
Monocytes Relative: 9.8 % (ref 3.0–12.0)
Neutro Abs: 5.3 10*3/uL (ref 1.4–7.7)
Neutrophils Relative %: 56.1 % (ref 43.0–77.0)
Platelets: 341 10*3/uL (ref 150.0–400.0)
RBC: 3.91 Mil/uL (ref 3.87–5.11)
RDW: 14.7 % (ref 11.5–15.5)
WBC: 9.4 10*3/uL (ref 4.0–10.5)

## 2020-03-10 LAB — B12 AND FOLATE PANEL
Folate: 12.6 ng/mL (ref >5.9)
Vitamin B-12: >1526 pg/mL — ABNORMAL HIGH (ref 211–911)

## 2020-03-10 LAB — IBC + FERRITIN
Ferritin: 9 ng/mL — ABNORMAL LOW (ref 10.0–291.0)
Iron: 53 ug/dL (ref 42–145)
Saturation Ratios: 11.1 % — ABNORMAL LOW (ref 20.0–50.0)
Transferrin: 340 mg/dL (ref 212.0–360.0)

## 2020-03-10 NOTE — Telephone Encounter (Signed)
I called pt to follow up pt states it was 100.00 to start and then a fee monthly pt ins did not cover.  Rejection Reason - Patient Declined" [facility] said about 23 hours ago   Pt also has a concern about allergies.Please advise Thank you!  Call pt @ [phone].

## 2020-03-10 NOTE — Telephone Encounter (Signed)
Spoke with the Patient and she is scheduled for a video visit 03/17/20 at 11:30 am

## 2020-03-10 NOTE — Telephone Encounter (Signed)
Appears pt has concern re: allergies Please make her an appt to discuss

## 2020-03-17 ENCOUNTER — Encounter: Payer: Self-pay | Admitting: Family

## 2020-03-17 ENCOUNTER — Telehealth (INDEPENDENT_AMBULATORY_CARE_PROVIDER_SITE_OTHER): Payer: No Typology Code available for payment source | Admitting: Family

## 2020-03-17 ENCOUNTER — Ambulatory Visit: Payer: No Typology Code available for payment source | Admitting: Podiatry

## 2020-03-17 DIAGNOSIS — D721 Eosinophilia, unspecified: Secondary | ICD-10-CM | POA: Insufficient documentation

## 2020-03-17 DIAGNOSIS — D649 Anemia, unspecified: Secondary | ICD-10-CM | POA: Diagnosis not present

## 2020-03-17 DIAGNOSIS — D7219 Other eosinophilia: Secondary | ICD-10-CM

## 2020-03-17 DIAGNOSIS — J309 Allergic rhinitis, unspecified: Secondary | ICD-10-CM | POA: Diagnosis not present

## 2020-03-17 NOTE — Patient Instructions (Addendum)
Trial allegra, flonase for allergies  Referral to GYN to bleeding/anemia  Let us know if you dont hear back within a week in regards to an appointment being scheduled.   Return stool cards to our office  We will repeat labs at follow-up in 2 or 3 months to recheck eosinophils ( they had been elevated) and see if those have returned to normal

## 2020-03-17 NOTE — Assessment & Plan Note (Addendum)
Trending down.  Suspect allergies are playing a role here.  Will recheck in 2 to 3 months, if remains persistently elevated, will consult hematology. Patient agrees with plan.

## 2020-03-17 NOTE — Assessment & Plan Note (Signed)
Advised trial of Flonase, Allegra.  Also advised that patient needs to be on antihistamine daily to be effective.  She will also trial nasal saline spray.  She will let me know how she is doing.

## 2020-03-17 NOTE — Assessment & Plan Note (Addendum)
Hemoglobin improving, eosinophils remain elevated. Pending stool cards, GYN consult due to heavy periods.

## 2020-03-17 NOTE — Progress Notes (Signed)
Virtual Visit via Video Note  I connected with@  on 03/17/20 at 11:30 AM EDT by a video enabled telemedicine application and verified that I am speaking with the correct person using two identifiers.  Location patient: home Location provider:work Persons participating in the virtual visit: patient, provider  I discussed the limitations of evaluation and management by telemedicine and the availability of in person appointments. The patient expressed understanding and agreed to proceed.  Interactive audio and video telecommunications were attempted between this provider and patient, however failed, due to patient having technical difficulties or patient did not have access to video capability.  We continued and completed visit with audio only.   HPI: Complains of allergies which is worsening over the past month.  Itchy nose, nasal congestion, and 'cannot stop sneezing'.  Taking claritin OTC prn without relief. Tried advil allergy without relief.  Allergies started about 3 years ago. Notices when cold air in conditioning. Not sure what triggers are. Has puppy right now. Cats before.  No cough, sob, wheezing. No h/o asthma, eczema. Nonsmoker.   Never seen allergist.   Gets b12 injections at weight loss clinic.    Anemia- eats red meat. Menses are very heavy with clots the size of quarters to big as fist, this has been going on for long period of time.    Eosinophils- normal 2014  ROS: See pertinent positives and negatives per HPI.  History reviewed. No pertinent past medical history.  History reviewed. No pertinent surgical history.  History reviewed. No pertinent family history.    Current Outpatient Medications:  .  terbinafine (LAMISIL) 250 MG tablet, Take 1 tablet (250 mg total) by mouth daily., Disp: 90 tablet, Rfl: 0 .  phentermine 37.5 MG capsule, Take 37.5 mg by mouth every morning. (Patient not taking: Reported on 03/17/2020), Disp: , Rfl:   EXAM:   GENERAL: alert,  oriented PSYCH/NEURO: pleasant and cooperative, no obvious depression or anxiety, speech and thought processing grossly intact  ASSESSMENT AND PLAN:  Discussed the following assessment and plan:  Anemia, unspecified type - Plan: Fecal occult blood, imunochemical, Ambulatory referral to Obstetrics / Gynecology  Eosinophilic leukocytosis, unspecified type  Allergic rhinitis, unspecified seasonality, unspecified trigger Problem List Items Addressed This Visit      Respiratory   Allergic rhinitis    Advised trial of Flonase, Allegra.  Also advised that patient needs to be on antihistamine daily to be effective.  She will also trial nasal saline spray.  She will let me know how she is doing.         Other   Anemia    Hemoglobin improving, eosinophils remain elevated. Pending stool cards, GYN consult due to heavy periods.       Relevant Orders   Fecal occult blood, imunochemical   Ambulatory referral to Obstetrics / Gynecology   Eosinophilia    Trending down.  Suspect allergies are playing a role here.  Will recheck in 2 to 3 months, if remains persistently elevated, will consult hematology. Patient agrees with plan.          -we discussed possible serious and likely etiologies, options for evaluation and workup, limitations of telemedicine visit vs in person visit, treatment, treatment risks and precautions. Pt prefers to treat via telemedicine empirically rather then risking or undertaking an in person visit at this moment. Patient agrees to seek prompt in person care if worsening, new symptoms arise, or if is not improving with treatment.   I discussed the assessment and treatment  plan with the patient. The patient was provided an opportunity to ask questions and all were answered. The patient agreed with the plan and demonstrated an understanding of the instructions.   The patient was advised to call back or seek an in-person evaluation if the symptoms worsen or if the condition  fails to improve as anticipated.   Rennie Plowman, FNP   I have spent 20 minutes with a patient including precharting, exam, reviewing medical records, and discussion plan of care.

## 2020-03-20 ENCOUNTER — Other Ambulatory Visit: Payer: Self-pay | Admitting: Podiatry

## 2020-03-25 MED ORDER — TERBINAFINE HCL 250 MG PO TABS: 250.0000 mg | ORAL_TABLET | Freq: Every day | ORAL | 0 refills | Status: DC

## 2020-03-27 ENCOUNTER — Encounter: Payer: Self-pay | Admitting: Obstetrics and Gynecology

## 2020-03-31 ENCOUNTER — Telehealth: Payer: Self-pay | Admitting: Family

## 2020-03-31 ENCOUNTER — Telehealth: Payer: Self-pay | Admitting: Podiatry

## 2020-03-31 NOTE — Telephone Encounter (Signed)
I'm calling about a no show charge I got. I did go ahead and cancel the appointment via the text message you sent out. I texted you guys back that I wasn't going to be able to make that appointment and I still got a charge. If you would please call me back at [phone].

## 2020-03-31 NOTE — Telephone Encounter (Signed)
Pt states that she received a 'no show' charge from 02/12/20 for her physical. She states that she did not know about that appt or she would have shown for it. I confirmed her number was correct. She states that she will have to go to another clinic due to no show fee. FYI

## 2020-04-02 NOTE — Telephone Encounter (Signed)
Mary Conner, was pt called prior to appt?

## 2020-04-02 NOTE — Telephone Encounter (Signed)
It was noted in the appointment notes that she was screened by phone and she was notified by Mychart and automated reminder.

## 2020-04-21 ENCOUNTER — Encounter: Payer: Self-pay | Admitting: Obstetrics and Gynecology

## 2020-05-27 MED FILL — ONDANSETRON ODT 4 MG TABLET: 4 | 5 days supply | Qty: 14 | Fill #0

## 2020-06-17 ENCOUNTER — Ambulatory Visit: Payer: No Typology Code available for payment source | Admitting: Family

## 2020-07-03 ENCOUNTER — Other Ambulatory Visit: Payer: Self-pay | Admitting: Allergy

## 2021-03-07 ENCOUNTER — Ambulatory Visit
Admission: RE | Admit: 2021-03-07 | Discharge: 2021-03-07 | Disposition: A | Discharge status: Home / Self Care | Payer: No Typology Code available for payment source | Source: Ambulatory Visit | Attending: Emergency Medicine | Admitting: Emergency Medicine

## 2021-03-07 ENCOUNTER — Ambulatory Visit: Payer: Self-pay

## 2021-03-07 ENCOUNTER — Other Ambulatory Visit: Payer: Self-pay

## 2021-03-07 VITALS — BP 126/88 | HR 71 | Temp 98.5°F | Resp 18 | Ht 65.0 in | Wt 220.0 lb

## 2021-03-07 DIAGNOSIS — H1032 Unspecified acute conjunctivitis, left eye: Secondary | ICD-10-CM | POA: Diagnosis not present

## 2021-03-07 MED ORDER — TOBRAMYCIN-DEXAMETHASONE 0.3-0.1 % OP SUSP: 2.0000 [drp] | Freq: Four times a day (QID) | OPHTHALMIC | 0 refills | Status: DC

## 2021-03-07 NOTE — Discharge Instructions (Signed)
Instill 2 drops of Tobradex in each eye every 6 hours for the next 5 days for treatment of your conjunctivitis.  Avoid touching your eyes as much as possible.  Wipe down all surfaces, countertops, and doorknobs after the first and second 24 hours on eyedrops.  Wash her face with a clean wash rag to remove any drainage and use a different portion of the wash rag to clean each eye so as to not reinfect yourself.  Return for reevaluation for any new or worsening symptoms.

## 2021-03-07 NOTE — ED Triage Notes (Signed)
Pt c/o left eye irritation since yesterday. Pt states it has yellow discharge and is very painful. Pt also reports blurry vision. Pt denies any known injury to the eye.

## 2021-03-07 NOTE — ED Provider Notes (Signed)
[facility]    CSN: [id number] Arrival date & time: 03/07/21  1143      History   Chief Complaint Chief Complaint  Patient presents with   Eye Problem    left    HPI Mary Conner is a 40 y.o. female.   HPI  40 year old female here for evaluation of left eye irritation.  Patient reports that she developed left eye irritation yesterday morning and then when she woke up this morning her eye was matted shut with mucousy discharge.  She reports that she also developed blurry vision this morning and pain with eye movement.  She denies any contact wearing, itching from the eye, or injury.  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  The other thing she is concerned about is that it may be her liquid eyeliner which she has not worn in a while may have caused the infection.  History reviewed. No pertinent past medical history.  Patient Active Problem List   Diagnosis Date Noted   Anemia 03/17/2020   Eosinophilia 03/17/2020   Allergic rhinitis 03/17/2020   Encounter to establish care 11/02/2019   Obesity (BMI 30-39.9) 11/02/2019   Nail fungus 11/02/2019    History reviewed. No pertinent surgical history.  OB History   No obstetric history on file.      Home Medications    Prior to Admission medications   Medication Sig Start Date End Date Taking? Authorizing Provider  tobramycin-dexamethasone [facility]) ophthalmic solution Place 2 drops into the left eye every 6 (six) hours. 03/07/21  Yes Becky Augusta, NP    Family History History reviewed. No pertinent family history.  Social History Social History   Tobacco Use   Smoking status: Never   Smokeless tobacco: Never  Vaping Use   Vaping Use: Never used  Substance Use Topics   Alcohol use: Yes    Comment: occasionally   Drug use: No     Allergies   Patient has no known allergies.   Review of Systems Review of Systems  Constitutional:   Negative for activity change, appetite change and fever.  Eyes:  Positive for pain, discharge, redness and visual disturbance. Negative for photophobia and itching.  Hematological: Negative.   Psychiatric/Behavioral: Negative.      Physical Exam Triage Vital Signs ED Triage Vitals  Enc Vitals Group     BP 03/07/21 1222 126/88     Pulse Rate 03/07/21 1222 71     Resp 03/07/21 1222 18     Temp 03/07/21 1222 98.5 F (36.9 C)     Temp Source 03/07/21 1222 Oral     SpO2 03/07/21 1222 100 %     Weight 03/07/21 1219 220 lb (99.8 kg)     Height 03/07/21 1219 5\' 5"  (1.651 m)     Head Circumference --      Peak Flow --      Pain Score 03/07/21 1219 3     Pain Loc --      Pain Edu? --      Excl. in GC? --    No data found.  Updated Vital Signs BP 126/88 (BP Location: Left Arm)   Pulse 71   Temp 98.5 F (36.9 C) (Oral)   Resp 18   Ht 5\' 5"  (1.651 m)   Wt 220 lb (99.8 kg)   LMP 03/07/2021   SpO2 100%   BMI 36.61 kg/m   Visual Acuity  Right Eye Distance:   Left Eye Distance:   Bilateral Distance:    Right Eye Near:   Left Eye Near:    Bilateral Near:     Physical Exam Vitals and nursing note reviewed.  Constitutional:      General: She is not in acute distress.    Appearance: Normal appearance. She is normal weight. She is not ill-appearing.  HENT:     Head: Normocephalic and atraumatic.  Eyes:     General: No scleral icterus.       Left eye: Discharge present.    Extraocular Movements: Extraocular movements intact.     Pupils: Pupils are equal, round, and reactive to light.  Skin:    General: Skin is warm and dry.     Capillary Refill: Capillary refill takes less than 2 seconds.  Neurological:     General: No focal deficit present.     Mental Status: She is alert and oriented to person, place, and time. Mental status is at baseline.  Psychiatric:        Mood and Affect: Mood normal.        Behavior: Behavior normal.        Thought Content: Thought content  normal.     UC Treatments / Results  Labs (all labs ordered are listed, but only abnormal results are displayed) Labs Reviewed - No data to display  EKG   Radiology No results found.  Procedures Procedures (including critical care time)  Medications Ordered in UC Medications - No data to display  Initial Impression / Assessment and Plan / UC Course  I have reviewed the triage vital signs and the nursing notes.  Pertinent labs & imaging results that were available during my care of the patient were reviewed by me and considered in my medical decision making (see chart for details).  Is a very pleasant 40 year old female here for evaluation of left eye irritation, discharge, and blurry vision that started yesterday and worsened today.  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  Patient's physical exam reveals an injected left eye with mucopurulent discharge in the eyelid margin and on both eyelashes.  Pupils equal round and reactive.  Patient does have pain with inferior eye movement but does not have pain with other eye movements.  Patient's eye was stained with fluorescein and examined under Woods lamp.  There is no evidence of corneal abrasion.  Will treat patient with TobraDex, 2 drops 4 times daily x5 days.  Patient vies if her symptoms worsen to include eye swelling, increased pain, increased blurriness to her vision, or fever she is to go to the ER for evaluation.  Work note provided.   Final Clinical Impressions(s) / UC Diagnoses   Final diagnoses:  Acute conjunctivitis of left eye, unspecified acute conjunctivitis type     Discharge Instructions      Instill 2 drops of Tobradex in each eye every 6 hours for the next 5 days for treatment of your conjunctivitis.  Avoid touching your eyes as much as possible.  Wipe down all surfaces, countertops, and doorknobs after the first and second 24 hours on eyedrops.  Wash her face  with a clean wash rag to remove any drainage and use a different portion of the wash rag to clean each eye so as to not reinfect yourself.  Return for reevaluation for any new or worsening symptoms.      ED Prescriptions  Medication Sig Dispense Auth. Provider   tobramycin-dexamethasone [facility]) ophthalmic solution Place 2 drops into the left eye every 6 (six) hours. 5 mL Becky Augusta, NP      PDMP not reviewed this encounter.   Becky Augusta, NP 03/07/21 1256

## 2021-03-26 ENCOUNTER — Telehealth: Payer: Self-pay | Admitting: Family Medicine

## 2021-03-26 ENCOUNTER — Telehealth: Payer: No Typology Code available for payment source | Admitting: Family Medicine

## 2021-03-26 ENCOUNTER — Telehealth: Payer: No Typology Code available for payment source | Admitting: Physician Assistant

## 2021-03-26 DIAGNOSIS — M545 Low back pain, unspecified: Secondary | ICD-10-CM | POA: Diagnosis not present

## 2021-03-26 DIAGNOSIS — M549 Dorsalgia, unspecified: Secondary | ICD-10-CM

## 2021-03-26 MED ORDER — CYCLOBENZAPRINE HCL 10 MG PO TABS: 10.0000 mg | ORAL_TABLET | Freq: Three times a day (TID) | ORAL | 0 refills | Status: DC | PRN

## 2021-03-26 MED ORDER — NAPROXEN 500 MG PO TABS: 500.0000 mg | ORAL_TABLET | Freq: Two times a day (BID) | ORAL | 0 refills | Status: DC

## 2021-03-26 NOTE — Progress Notes (Signed)
Virtual Visit Consent   Mary Conner, you are scheduled for a virtual visit with a [facility] provider today.     Just as with appointments in the office, your consent must be obtained to participate.  Your consent will be active for this visit and any virtual visit you may have with one of our providers in the next 365 days.     If you have a MyChart account, a copy of this consent can be sent to you electronically.  All virtual visits are billed to your insurance company just like a traditional visit in the office.    As this is a virtual visit, video technology does not allow for your provider to perform a traditional examination.  This may limit your provider's ability to fully assess your condition.  If your provider identifies any concerns that need to be evaluated in person or the need to arrange testing (such as labs, EKG, etc.), we will make arrangements to do so.     Although advances in technology are sophisticated, we cannot ensure that it will always work on either your end or our end.  If the connection with a video visit is poor, the visit may have to be switched to a telephone visit.  With either a video or telephone visit, we are not always able to ensure that we have a secure connection.     I need to obtain your verbal consent now.   Are you willing to proceed with your visit today?    Mary Conner has provided verbal consent on 03/26/2021 for a virtual visit (video or telephone).   [name], [place]   Date: 03/26/2021 6:49 PM   Virtual Visit via Video Note   I, [name], PA-C, attempted to connect with Mary Conner; MRN [id number] on 03/26/21 via [employer] to complete a video urgent care visit. The patient was unable to successfully connect to the video platform. As such, the patient was contacted by this provider via phone to complete the encounter.   Location: Patient: Virtual Visit Location Patient: Home Provider: Virtual  Visit Location Provider: Home Office   I discussed the limitations of evaluation and management by telemedicine and the availability of in person appointments. The patient expressed understanding and agreed to proceed.    History of Present Illness: Mary Conner is a 40 y.o. who identifies as a female who was assigned female at birth, and is being seen today for bilateral low back pain, L > R starting after exercise class last night. Denies any noted injury or trauma while working out. Noted some soreness and tried to work through the pain. This morning notes more significant pain, especially with positional changes. Pain is non-radiating. Denies any skin changes or swelling. Denies any history of back injury. Has been applying heating pad and taking Ibuprofen but these are only mildly beneficial.  HPI: HPI  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   Allergic rhinitis 03/17/2020   Encounter to establish care 11/02/2019   Obesity (BMI 30-39.9) 11/02/2019   Nail fungus 11/02/2019    Allergies: No Known Allergies Medications:  Current Outpatient Medications:    cyclobenzaprine (FLEXERIL) 10 MG tablet, Take 1 tablet (10 mg total) by mouth 3 (three) times daily as needed for muscle spasms., Disp: 15 tablet, Rfl: 0   naproxen (NAPROSYN) 500 MG tablet, Take 1 tablet (500 mg total) by mouth  2 (two) times daily with a meal., Disp: 20 tablet, Rfl: 0   tobramycin-dexamethasone (TOBRADEX) ophthalmic solution, Place 2 drops into the left eye every 6 (six) hours., Disp: 5 mL, Rfl: 0  Observations/Objective: No labored breathing. Speech is clear and coherent with logical content.  Patient is alert and oriented at baseline.   Assessment and Plan: 1. Acute bilateral low back pain without sciatica - naproxen (NAPROSYN) 500 MG tablet; Take 1 tablet (500 mg total) by mouth 2 (two) times daily with a meal.  Dispense: 20 tablet;  Refill: 0 - cyclobenzaprine (FLEXERIL) 10 MG tablet; Take 1 tablet (10 mg total) by mouth 3 (three) times daily as needed for muscle spasms.  Dispense: 15 tablet; Refill: 0 No alarm signs/symptoms. Suspect muscle strain from overuse and subsequent spasm development. Supportive measures and OTC medications reviewed. Exercises reviewed. Rx Naprosyn and Flexeril to use as directed. She is aware not to drive when taking Flexeril in case of drowsiness. She will need in-office follow-up for any non-resolving, new or worsening symptoms.  Follow Up Instructions: I discussed the assessment and treatment plan with the patient. The patient was provided an opportunity to ask questions and all were answered. The patient agreed with the plan and demonstrated an understanding of the instructions.  A copy of instructions were sent to the patient via MyChart.  The patient was advised to call back or seek an in-person evaluation if the symptoms worsen or if the condition fails to improve as anticipated.  Time:  I spent 10 minutes with the patient via telehealth technology discussing the above problems/concerns.    [name], PA-C

## 2021-03-26 NOTE — Progress Notes (Signed)
Did video

## 2021-03-26 NOTE — Patient Instructions (Signed)
Mary Conner, thank you for joining [name], PA-C for today's virtual visit.  While this provider is not your primary care provider (PCP), if your PCP is located in our provider database this encounter information will be shared with them immediately following your visit.  Consent: (Patient) Mary Conner provided verbal consent for this virtual visit at the beginning of the encounter.  Current Medications:  Current Outpatient Medications:    cyclobenzaprine (FLEXERIL) 10 MG tablet, Take 1 tablet (10 mg total) by mouth 3 (three) times daily as needed for muscle spasms., Disp: 15 tablet, Rfl: 0   naproxen (NAPROSYN) 500 MG tablet, Take 1 tablet (500 mg total) by mouth 2 (two) times daily with a meal., Disp: 20 tablet, Rfl: 0   tobramycin-dexamethasone (TOBRADEX) ophthalmic solution, Place 2 drops into the left eye every 6 (six) hours., Disp: 5 mL, Rfl: 0   Medications ordered in this encounter:  Meds ordered this encounter  Medications   naproxen (NAPROSYN) 500 MG tablet    Sig: Take 1 tablet (500 mg total) by mouth 2 (two) times daily with a meal.    Dispense:  20 tablet    Refill:  0    Order Specific Question:   Supervising Provider    Answer:   Hyacinth Meeker, BRIAN [[id number]]   cyclobenzaprine (FLEXERIL) 10 MG tablet    Sig: Take 1 tablet (10 mg total) by mouth 3 (three) times daily as needed for muscle spasms.    Dispense:  15 tablet    Refill:  0    Order Specific Question:   Supervising Provider    Answer:   Hyacinth Meeker, BRIAN [[id number]]     *If you need refills on other medications prior to your next appointment, please contact your pharmacy*  Follow-Up: Call back or seek an in-person evaluation if the symptoms worsen or if the condition fails to improve as anticipated.  Other Instructions Please avoid heavy lifting and overexertion.  Take the Naprosyn as directed. You can use Tylenol for breakthrough pain. Continue use of a heating pad for 10-15 minutes, a few  times per day. Take the Flexeril in the evening. You can use up to three times daily but no driving or operating heavy machinery while on this medication. As symptoms are easing up significantly, begin some of the stretches below.   If symptoms are not resolving, anything worsens or new symptoms develop, please be evaluated in person with your PCP or at a local urgent care.   Low Back Sprain or Strain Rehab Ask your health care provider which exercises are safe for you. Do exercises exactly as told by your health care provider and adjust them as directed. It is normal to feel mild stretching, pulling, tightness, or discomfort as you do these exercises. Stop right away if you feel sudden pain or your pain gets worse. Do not begin these exercises until told by your health care provider. Stretching and range-of-motion exercises These exercises warm up your muscles and joints and improve the movement and flexibility of your back. These exercises also help to relieve pain, numbness,and tingling. Lumbar rotation  Lie on your back on a firm surface and bend your knees. Straighten your arms out to your sides so each arm forms a 90-degree angle (right angle) with a side of your body. Slowly move (rotate) both of your knees to one side of your body until you feel a stretch in your lower back (lumbar). Try not to let your shoulders lift off  the floor. Hold this position for __________ seconds. Tense your abdominal muscles and slowly move your knees back to the starting position. Repeat this exercise on the other side of your body. Repeat __________ times. Complete this exercise __________ times a day. Single knee to chest  Lie on your back on a firm surface with both legs straight. Bend one of your knees. Use your hands to move your knee up toward your chest until you feel a gentle stretch in your lower back and buttock. Hold your leg in this position by holding on to the front of your knee. Keep your  other leg as straight as possible. Hold this position for __________ seconds. Slowly return to the starting position. Repeat with your other leg. Repeat __________ times. Complete this exercise __________ times a day. Prone extension on elbows  Lie on your abdomen on a firm surface (prone position). Prop yourself up on your elbows. Use your arms to help lift your chest up until you feel a gentle stretch in your abdomen and your lower back. This will place some of your body weight on your elbows. If this is uncomfortable, try stacking pillows under your chest. Your hips should stay down, against the surface that you are lying on. Keep your hip and back muscles relaxed. Hold this position for __________ seconds. Slowly relax your upper body and return to the starting position. Repeat __________ times. Complete this exercise __________ times a day. Strengthening exercises These exercises build strength and endurance in your back. Endurance is theability to use your muscles for a long time, even after they get tired. Pelvic tilt This exercise strengthens the muscles that lie deep in the abdomen. Lie on your back on a firm surface. Bend your knees and keep your feet flat on the floor. Tense your abdominal muscles. Tip your pelvis up toward the ceiling and flatten your lower back into the floor. To help with this exercise, you may place a small towel under your lower back and try to push your back into the towel. Hold this position for __________ seconds. Let your muscles relax completely before you repeat this exercise. Repeat __________ times. Complete this exercise __________ times a day. Alternating arm and leg raises  Get on your hands and knees on a firm surface. If you are on a hard floor, you may want to use padding, such as an exercise mat, to cushion your knees. Line up your arms and legs. Your hands should be directly below your shoulders, and your knees should be directly below your  hips. Lift your left leg behind you. At the same time, raise your right arm and straighten it in front of you. Do not lift your leg higher than your hip. Do not lift your arm higher than your shoulder. Keep your abdominal and back muscles tight. Keep your hips facing the ground. Do not arch your back. Keep your balance carefully, and do not hold your breath. Hold this position for __________ seconds. Slowly return to the starting position. Repeat with your right leg and your left arm. Repeat __________ times. Complete this exercise __________ times a day. Abdominal set with straight leg raise  Lie on your back on a firm surface. Bend one of your knees and keep your other leg straight. Tense your abdominal muscles and lift your straight leg up, 4-6 inches (10-15 cm) off the ground. Keep your abdominal muscles tight and hold this position for __________ seconds. Do not hold your breath. Do not arch  your back. Keep it flat against the ground. Keep your abdominal muscles tense as you slowly lower your leg back to the starting position. Repeat with your other leg. Repeat __________ times. Complete this exercise __________ times a day. Single leg lower with bent knees Lie on your back on a firm surface. Tense your abdominal muscles and lift your feet off the floor, one foot at a time, so your knees and hips are bent in 90-degree angles (right angles). Your knees should be over your hips and your lower legs should be parallel to the floor. Keeping your abdominal muscles tense and your knee bent, slowly lower one of your legs so your toe touches the ground. Lift your leg back up to return to the starting position. Do not hold your breath. Do not let your back arch. Keep your back flat against the ground. Repeat with your other leg. Repeat __________ times. Complete this exercise __________ times a day. Posture and body mechanics Good posture and healthy body mechanics can help to relieve  stress in your body's tissues and joints. Body mechanics refers to the movements and positions of your body while you do your daily activities. Posture is part of body mechanics. Good posture means: Your spine is in its natural S-curve position (neutral). Your shoulders are pulled back slightly. Your head is not tipped forward. Follow these guidelines to improve your posture and body mechanics in youreveryday activities. Standing  When standing, keep your spine neutral and your feet about hip width apart. Keep a slight bend in your knees. Your ears, shoulders, and hips should line up. When you do a task in which you stand in one place for a long time, place one foot up on a stable object that is 2-4 inches (5-10 cm) high, such as a footstool. This helps keep your spine neutral.  Sitting  When sitting, keep your spine neutral and keep your feet flat on the floor. Use a footrest, if necessary, and keep your thighs parallel to the floor. Avoid rounding your shoulders, and avoid tilting your head forward. When working at a desk or a computer, keep your desk at a height where your hands are slightly lower than your elbows. Slide your chair under your desk so you are close enough to maintain good posture. When working at a computer, place your monitor at a height where you are looking straight ahead and you do not have to tilt your head forward or downward to look at the screen.  Resting When lying down and resting, avoid positions that are most painful for you. If you have pain with activities such as sitting, bending, stooping, or squatting, lie in a position in which your body does not bend very much. For example, avoid curling up on your side with your arms and knees near your chest (fetal position). If you have pain with activities such as standing for a long time or reaching with your arms, lie with your spine in a neutral position and bend your knees slightly. Try the following positions: Lying  on your side with a pillow between your knees. Lying on your back with a pillow under your knees. Lifting  When lifting objects, keep your feet at least shoulder width apart and tighten your abdominal muscles. Bend your knees and hips and keep your spine neutral. It is important to lift using the strength of your legs, not your back. Do not lock your knees straight out. Always ask for help to lift heavy or  awkward objects.  This information is not intended to replace advice given to you by your health care provider. Make sure you discuss any questions you have with your healthcare provider. Document Revised: 12/29/2018 Document Reviewed: 09/28/2018 Elsevier Patient Education  2022 [employer].    If you have been instructed to have an in-person evaluation today at a local Urgent Care facility, please use the link below. It will take you to a list of all of our available [facility], including address, phone number and hours of operation. Please do not delay care.  [facility]  If you or a family member do not have a primary care provider, use the link below to schedule a visit and establish care. When you choose a [facility] primary care physician or advanced practice provider, you gain a long-term partner in health. Find a Primary Care Provider  Learn more about [facility]'s in-office and virtual care options: [facility] - Get Care Now

## 2021-06-02 ENCOUNTER — Telehealth: Payer: No Typology Code available for payment source | Admitting: Nurse Practitioner

## 2021-06-02 DIAGNOSIS — H9201 Otalgia, right ear: Secondary | ICD-10-CM

## 2021-06-02 MED ORDER — NEOMYCIN-POLYMYXIN-HC 3.5-10000-1 OT SOLN: 3.0000 [drp] | Freq: Four times a day (QID) | OTIC | 0 refills | Status: AC

## 2021-06-02 NOTE — Progress Notes (Signed)
E Visit for Ear Pain  We are sorry that you are not feeling well. Here is how we plan to help!  I have prescribed: Neomycin 0.35%, polymyxin B 10,000 units/mL, and hydrocortisone 0,5% otic solution 3 drops in affected ears four times a day for 7 days    In certain ear pain and infections may progress to a more serious bacterial infection of the middle or inner ear.  If you have a fever 102 and up and significantly worsening symptoms, this could indicate a more serious infection moving to the middle/inner and needs face to face evaluation in an office by a provider.  Your symptoms should improve over the next 3 days and should resolve in about 7 days.  HOME CARE:  Wash your hands frequently. Do not place the tip of the bottle on your ear or touch it with your fingers. You can take Acetominophen 650 mg every 4-6 hours as needed for pain.  If pain is severe or moderate, you can apply a heating pad (set on low) or hot water bottle (wrapped in a towel) to outer ear for 20 minutes.  This will also increase drainage. Avoid ear plugs Do not use Q-tips After showers, help the water run out by tilting your head to one side.  GET HELP RIGHT AWAY IF:  Fever is over 102.2 degrees. You develop progressive ear pain or hearing loss. Ear symptoms persist longer than 3 days after treatment.  MAKE SURE YOU:  Understand these instructions. Will watch your condition. Will get help right away if you are not doing well or get worse.  TO PREVENT SWIMMER'S EAR: Use a bathing cap or custom fitted swim molds to keep your ears dry. Towel off after swimming to dry your ears. Tilt your head or pull your earlobes to allow the water to escape your ear canal. If there is still water in your ears, consider using a hairdryer on the lowest setting.   Thank you for choosing an e-visit.  Your e-visit answers were reviewed by a board certified advanced clinical practitioner to complete your personal care plan.  Depending upon the condition, your plan could have included both over the counter or prescription medications.  Please review your pharmacy choice. Make sure the pharmacy is open so you can pick up prescription now. If there is a problem, you may contact your provider through [employer] and have the prescription routed to another pharmacy.  Your safety is important to [place]. If you have drug allergies check your prescription carefully.   For the next 24 hours you can use MyChart to ask questions about today's visit, request a non-urgent call back, or ask for a work or school excuse. You will get an email in the next two days asking about your experience. I hope that your e-visit has been valuable and will speed your recovery.  I spent approximately 10 minutes reviewing the patient's history, current symptoms and coordinating their care today.    Meds ordered this encounter  Medications   neomycin-polymyxin-hydrocortisone (CORTISPORIN) OTIC solution    Sig: Place 3 drops into the right ear 4 (four) times daily for 5 days.    Dispense:  10 mL    Refill:  0

## 2021-07-24 ENCOUNTER — Telehealth: Payer: No Typology Code available for payment source

## 2021-07-25 ENCOUNTER — Telehealth: Payer: No Typology Code available for payment source | Admitting: Nurse Practitioner

## 2021-07-25 DIAGNOSIS — M545 Low back pain, unspecified: Secondary | ICD-10-CM | POA: Diagnosis not present

## 2021-07-25 MED ORDER — CYCLOBENZAPRINE HCL 10 MG PO TABS: 10.0000 mg | ORAL_TABLET | Freq: Three times a day (TID) | ORAL | 1 refills | Status: DC | PRN

## 2021-07-25 MED ORDER — NAPROXEN 500 MG PO TABS
500.0000 mg | ORAL_TABLET | Freq: Two times a day (BID) | ORAL | 1 refills | Status: DC
Start: 2021-07-25 — End: 2021-08-23

## 2021-07-25 NOTE — Patient Instructions (Signed)

## 2021-07-25 NOTE — Progress Notes (Signed)
Virtual Visit Consent   Mary Conner, you are scheduled for a virtual visit with Mary-Margaret Daphine Deutscher, FNP, a [facility] provider, today.     Just as with appointments in the office, your consent must be obtained to participate.  Your consent will be active for this visit and any virtual visit you may have with one of our providers in the next 365 days.     If you have a MyChart account, a copy of this consent can be sent to you electronically.  All virtual visits are billed to your insurance company just like a traditional visit in the office.    As this is a virtual visit, video technology does not allow for your provider to perform a traditional examination.  This may limit your provider's ability to fully assess your condition.  If your provider identifies any concerns that need to be evaluated in person or the need to arrange testing (such as labs, EKG, etc.), we will make arrangements to do so.     Although advances in technology are sophisticated, we cannot ensure that it will always work on either your end or our end.  If the connection with a video visit is poor, the visit may have to be switched to a telephone visit.  With either a video or telephone visit, we are not always able to ensure that we have a secure connection.     I need to obtain your verbal consent now.   Are you willing to proceed with your visit today? YES   Mary Conner has provided verbal consent on 07/25/2021 for a virtual visit (video or telephone).   Mary-Margaret Daphine Deutscher, FNP   Date: 07/25/2021 8:30 AM   Virtual Visit via Video Note   I, Mary-Margaret Daphine Deutscher, connected with Mary Conner ([id number], [birth year]) on 07/25/21 at  8:30 AM EDT by a video-enabled telemedicine application and verified that I am speaking with the correct person using two identifiers.  Location: Patient: Virtual Visit Location Patient: Home Provider: Virtual Visit Location Provider: Mobile   I discussed  the limitations of evaluation and management by telemedicine and the availability of in person appointments. The patient expressed understanding and agreed to proceed.    History of Present Illness: Mary Conner is a 40 y.o. who identifies as a female who was assigned female at birth, and is being seen today for back pain.  HPI: Patient has been having back pain since Wednesday. She went to see chiropractor yesterday with no relief. Pain is in her lower back and does not radiate down either leg. Rates pain 3/10. Stabbing pain and is constant. Sitting increases pain. Messaging and putting pressure on area helps with pain. She denies any recent injury.   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   Allergic rhinitis 03/17/2020   Encounter to establish care 11/02/2019   Obesity (BMI 30-39.9) 11/02/2019   Nail fungus 11/02/2019    Allergies: No Known Allergies Medications:  Current Outpatient Medications:    cyclobenzaprine (FLEXERIL) 10 MG tablet, Take 1 tablet (10 mg total) by mouth 3 (three) times daily as needed for muscle spasms., Disp: 15 tablet, Rfl: 0   montelukast (SINGULAIR) 10 MG tablet, Take 10 mg by mouth daily., Disp: , Rfl:    naproxen (NAPROSYN) 500 MG tablet, Take 1 tablet (500 mg total) by mouth 2 (two) times daily with a meal., Disp: 20 tablet, Rfl: 0  Observations/Objective: Patient is well-developed, well-nourished in no acute distress.  Resting comfortably  at home.  Head is normocephalic, atraumatic.  No labored breathing.  Speech is clear and coherent with logical content.  Patient is alert and oriented at baseline.  FROM of lumbar spine with pain on flexion. (+) SLR  on left  Assessment and Plan:  [name] in today with chief complaint of No chief complaint on file.   1. Acute midline low back pain without sciatica Moist heat Rest No lifting Sedation precautions with  flexeril  Meds ordered this encounter  Medications   naproxen (NAPROSYN) 500 MG tablet    Sig: Take 1 tablet (500 mg total) by mouth 2 (two) times daily with a meal.    Dispense:  60 tablet    Refill:  1    Order Specific Question:   Supervising Provider    Answer:   Hyacinth Meeker, BRIAN [[id number]]   cyclobenzaprine (FLEXERIL) 10 MG tablet    Sig: Take 1 tablet (10 mg total) by mouth 3 (three) times daily as needed for muscle spasms.    Dispense:  30 tablet    Refill:  1    Order Specific Question:   Supervising Provider    Answer:   Eber Hong [[id number]]       The above assessment and management plan was discussed with the patient. The patient verbalized understanding of and has agreed to the management plan. Patient is aware to call the clinic if symptoms persist or worsen. Patient is aware when to return to the clinic for a follow-up visit. Patient educated on when it is appropriate to go to the emergency department.   Mary-Margaret Daphine Deutscher, FNP    Follow Up Instructions: I discussed the assessment and treatment plan with the patient. The patient was provided an opportunity to ask questions and all were answered. The patient agreed with the plan and demonstrated an understanding of the instructions.  A copy of instructions were sent to the patient via MyChart.  The patient was advised to call back or seek an in-person evaluation if the symptoms worsen or if the condition fails to improve as anticipated.  Time:  I spent 10 minutes with the patient via telehealth technology discussing the above problems/concerns.    Mary-Margaret Daphine Deutscher, FNP

## 2021-08-23 ENCOUNTER — Telehealth: Payer: No Typology Code available for payment source | Admitting: Family

## 2021-08-23 DIAGNOSIS — T148XXA Other injury of unspecified body region, initial encounter: Secondary | ICD-10-CM | POA: Diagnosis not present

## 2021-08-23 DIAGNOSIS — L03116 Cellulitis of left lower limb: Secondary | ICD-10-CM | POA: Diagnosis not present

## 2021-08-23 MED ORDER — CEPHALEXIN 500 MG PO CAPS: 500.0000 mg | ORAL_CAPSULE | Freq: Three times a day (TID) | ORAL | 0 refills | Status: AC

## 2021-08-23 MED ORDER — MONTELUKAST SODIUM 10 MG PO TABS: 10.0000 mg | ORAL_TABLET | Freq: Every day | ORAL | 1 refills | Status: DC

## 2021-08-23 NOTE — Progress Notes (Signed)
Virtual Visit Consent   Mary Conner, you are scheduled for a virtual visit with a [place] provider today.     Just as with appointments in the office, your consent must be obtained to participate.  Your consent will be active for this visit and any virtual visit you may have with one of our providers in the next 365 days.     If you have a MyChart account, a copy of this consent can be sent to you electronically.  All virtual visits are billed to your insurance company just like a traditional visit in the office.    As this is a virtual visit, video technology does not allow for your provider to perform a traditional examination.  This may limit your provider's ability to fully assess your condition.  If your provider identifies any concerns that need to be evaluated in person or the need to arrange testing (such as labs, EKG, etc.), we will make arrangements to do so.     Although advances in technology are sophisticated, we cannot ensure that it will always work on either your end or our end.  If the connection with a video visit is poor, the visit may have to be switched to a telephone visit.  With either a video or telephone visit, we are not always able to ensure that we have a secure connection.     I need to obtain your verbal consent now.   Are you willing to proceed with your visit today?    Mary Conner has provided verbal consent on 08/23/2021 for a virtual visit (video or telephone).   [name], FNP   Date: 08/23/2021 10:33 AM   Virtual Visit via Video Note   I, [name], connected with  Mary Conner  ([id number], 18-Sep-1981) on 08/23/21 at 10:30 AM EST by a video-enabled telemedicine application and verified that I am speaking with the correct person using two identifiers.  Location: Patient: Virtual Visit Location Patient: Other: friends home Provider: Virtual Visit Location Provider: Home Office   I discussed the limitations of  evaluation and management by telemedicine and the availability of in person appointments. The patient expressed understanding and agreed to proceed.    History of Present Illness: Mary Conner is a 40 y.o. who identifies as a female who was assigned female at birth, and is being seen today for laceration of left lower leg that happened about a week ago. States she was at the gym doing jumps and scrapped her leg against a wooden box. She reports the area is red, warm, tender. She reports her pain is a 4 out 10. She has been using OTC cream with mild relief. Denies any drainage or fevers.   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  Resting comfortably  Head is normocephalic, atraumatic.  No labored  breathing.  Speech is clear and coherent with logical content.  Patient is alert and oriented at baseline.  Dime size abrasion with scab on left lower leg and erythemas around border Bruising present   Assessment and Plan: 1. Abrasion - cephALEXin (KEFLEX) 500 MG capsule; Take 1 capsule (500 mg total) by mouth 3 (three) times daily for 7 days.  Dispense: 21 capsule; Refill: 0  2. Cellulitis of left lower extremity Keep clean and dry Report any increased in redness, swelling, discharge, or fevers Circle area with sharpie and let us know if it  changes  Follow Up Instructions: I discussed the assessment and treatment plan with the patient. The patient was provided an opportunity to ask questions and all were answered. The patient agreed with the plan and demonstrated an understanding of the instructions.  A copy of instructions were sent to the patient via MyChart unless otherwise noted below.     The patient was advised to call back or seek an in-person evaluation if the symptoms worsen or if the condition fails to improve as anticipated.  Time:  I spent 12 minutes with the patient via telehealth technology discussing the above problems/concerns.    [name], FNP

## 2021-10-06 ENCOUNTER — Telehealth: Payer: No Typology Code available for payment source | Admitting: Physician Assistant

## 2021-10-06 ENCOUNTER — Other Ambulatory Visit: Payer: Self-pay

## 2021-10-06 DIAGNOSIS — J301 Allergic rhinitis due to pollen: Secondary | ICD-10-CM | POA: Diagnosis not present

## 2021-10-06 MED ORDER — LEVOCETIRIZINE DIHYDROCHLORIDE 5 MG PO TABS
5.0000 mg | ORAL_TABLET | Freq: Every evening | ORAL | 0 refills | Status: DC
  Filled 2021-10-06: qty 30, 30d supply, fill #0

## 2021-10-06 MED ORDER — OLOPATADINE HCL 0.2 % OP SOLN
1.0000 [drp] | Freq: Every day | OPHTHALMIC | 0 refills | Status: DC
  Filled 2021-10-06: qty 2.5, 30d supply, fill #0

## 2021-10-06 NOTE — Progress Notes (Signed)
E visit for Allergic Rhinitis We are sorry that you are not feeling well.  Here is how we plan to help!  Based on what you have shared with me it looks like you have Allergic Rhinitis.  Rhinitis is when a reaction occurs that causes nasal congestion, runny nose, sneezing, and itching.  Most types of rhinitis are caused by an inflammation and are associated with symptoms in the eyes ears or throat. There are several types of rhinitis.  The most common are acute rhinitis, which is usually caused by a viral illness, allergic or seasonal rhinitis, and nonallergic or year-round rhinitis.  Nasal allergies occur certain times of the year.  Allergic rhinitis is caused when allergens in the air trigger the release of histamine in the body.  Histamine causes itching, swelling, and fluid to build up in the fragile linings of the nasal passages, sinuses and eyelids.  An itchy nose and clear discharge are common.  I recommend the following over the counter treatments: You should take a daily dose of antihistamine and Xyzal 5 mg take 1 tablet daily at bedtime (change from claritin). I will send in prescription, but is available OTC.   I also would recommend a nasal spray: Nasonex 2 sprays into each nostril once daily and Saline 1 spray into each nostril as needed Continue this regimen.   You may also benefit from eye drops such as: Pataday eye drops for itching, available OTC, but I will prescribe  HOME CARE:  You can use an over-the-counter saline nasal spray as needed Avoid areas where there is heavy dust, mites, or molds Stay indoors on windy days during the pollen season Keep windows closed in home, at least in bedroom; use air conditioner. Use high-efficiency house air filter Keep windows closed in car, turn AC on re-circulate Avoid playing out with dog during pollen season  GET HELP RIGHT AWAY IF:  If your symptoms do not improve within 10 days You become short of breath You develop yellow or  green discharge from your nose for over 3 days You have coughing fits  MAKE SURE YOU:  Understand these instructions Will watch your condition Will get help right away if you are not doing well or get worse  Thank you for choosing an e-visit. Your e-visit answers were reviewed by a board certified advanced clinical practitioner to complete your personal care plan. Depending upon the condition, your plan could have included both over the counter or prescription medications. Please review your pharmacy choice. Be sure that the pharmacy you have chosen is open so that you can pick up your prescription now.  If there is a problem you may message your provider in MyChart to have the prescription routed to another pharmacy. Your safety is important to [place]. If you have drug allergies check your prescription carefully.  For the next 24 hours, you can use MyChart to ask questions about todays visit, request a non-urgent call back, or ask for a work or school excuse from your e-visit provider. You will get an email in the next two days asking about your experience. I hope that your e-visit has been valuable and will speed your recovery.  I provided 5 minutes of non face-to-face time during this encounter for chart review and documentation.

## 2021-11-05 ENCOUNTER — Telehealth: Payer: No Typology Code available for payment source | Admitting: Physician Assistant

## 2021-11-05 DIAGNOSIS — M79671 Pain in right foot: Secondary | ICD-10-CM | POA: Diagnosis not present

## 2021-11-05 MED ORDER — MELOXICAM 15 MG PO TABS
15.0000 mg | ORAL_TABLET | Freq: Every day | ORAL | 0 refills | Status: DC
Start: 2021-11-05 — End: 2022-02-21

## 2021-11-05 NOTE — Patient Instructions (Addendum)
Mary Conner, thank you for joining [name], PA-C for today's virtual visit.  While this provider is not your primary care provider (PCP), if your PCP is located in our provider database this encounter information will be shared with them immediately following your visit.  Consent: (Patient) MONDAY AMTHOR provided verbal consent for this virtual visit at the beginning of the encounter.  Current Medications:  Current Outpatient Medications:    levocetirizine (XYZAL) 5 MG tablet, Take 1 tablet (5 mg total) by mouth every evening., Disp: 30 tablet, Rfl: 0   montelukast (SINGULAIR) 10 MG tablet, Take 1 tablet (10 mg total) by mouth daily., Disp: 90 tablet, Rfl: 1   Olopatadine HCl 0.2 % SOLN, Apply 1 drop to eye daily., Disp: 2.5 mL, Rfl: 0   Medications ordered in this encounter:  No orders of the defined types were placed in this encounter.    *If you need refills on other medications prior to your next appointment, please contact your pharmacy*  Follow-Up: Call back or seek an in-person evaluation if the symptoms worsen or if the condition fails to improve as anticipated.  Other Instructions Make sure to wear supportive footwear -- good arch support. Wear a compression wrap as discussed.  Elevate foot while resting.  Take the Mobic daily with food. You can use Tylenol for breakthrough pain. Do the cold can exercises as discussed. Follow stretches below.  If not resolving or any worsening symptoms you will need an in-person evaluation.   Plantar Fasciitis Rehab Ask your health care provider which exercises are safe for you. Do exercises exactly as told by your health care provider and adjust them as directed. It is normal to feel mild stretching, pulling, tightness, or discomfort as you do these exercises. Stop right away if you feel sudden pain or your pain gets worse. Do not begin these exercises until told by your health care provider. Stretching and  range-of-motion exercises These exercises warm up your muscles and joints and improve the movement and flexibility of your foot. These exercises also help to relieve pain. Plantar fascia stretch  Sit with your left / right leg crossed over your opposite knee. Hold your heel with one hand with that thumb near your arch. With your other hand, hold your toes and gently pull them back toward the top of your foot. You should feel a stretch on the base (bottom) of your toes, or the bottom of your foot (plantar fascia), or both. Hold this stretch for__________ seconds. Slowly release your toes and return to the starting position. Repeat __________ times. Complete this exercise __________ times a day. Gastrocnemius stretch, standing This exercise is also called a calf (gastroc) stretch. It stretches the muscles in the back of the upper calf. Stand with your hands against a wall. Extend your left / right leg behind you, and bend your front knee slightly. Keeping your heels on the floor, your toes facing forward, and your back knee straight, shift your weight toward the wall. Do not arch your back. You should feel a gentle stretch in your upper calf. Hold this position for __________ seconds. Repeat __________ times. Complete this exercise __________ times a day. Soleus stretch, standing This exercise is also called a calf (soleus) stretch. It stretches the muscles in the back of the lower calf. Stand with your hands against a wall. Extend your left / right leg behind you, and bend your front knee slightly. Keeping your heels on the floor and your toes  facing forward, bend your back knee and shift your weight slightly over your back leg. You should feel a gentle stretch deep in your lower calf. Hold this position for __________ seconds. Repeat __________ times. Complete this exercise __________ times a day. Gastroc and soleus stretch, standing step This exercise stretches the muscles in the back of the  lower leg. These muscles are in the upper calf (gastrocnemius) and the lower calf (soleus). Stand with the ball of your left / right foot on the front of a step. The ball of your foot is on the walking surface, right under your toes. Keep your other foot firmly on the same step. Hold on to the wall or a railing for balance. Slowly lift your other foot, allowing your body weight to press your heel down over the edge of the front of the step. Keep knee straight and unbent. You should feel a stretch in your calf. Hold this position for __________ seconds. Return both feet to the step. Repeat this exercise with a slight bend in your left / right knee. Repeat __________ times with your left / right knee straight and __________ times with your left / right knee bent. Complete this exercise __________ times a day. Balance exercise This exercise builds your balance and strength control of your arch to help take pressure off your plantar fascia. Single leg stand If this exercise is too easy, you can try it with your eyes closed or while standing on a pillow. Without shoes, stand near a railing or in a doorway. You may hold on to the railing or door frame as needed. Stand on your left / right foot. Keep your big toe down on the floor and lift the arch of your foot. You should feel a stretch across the bottom of your foot and your arch. Do not let your foot roll inward. Hold this position for __________ seconds. Repeat __________ times. Complete this exercise __________ times a day. This information is not intended to replace advice given to you by your health care provider. Make sure you discuss any questions you have with your health care provider. Document Revised: 06/19/2020 Document Reviewed: 06/19/2020 Elsevier Patient Education  2022 Elsevier Inc.   Plantar Fasciitis Plantar fasciitis is a painful foot condition that affects the heel. It occurs when the band of tissue that connects the toes to  the heel bone (plantar fascia) becomes irritated. This can happen as the result of exercising too much or doing other repetitive activities (overuse injury). Plantar fasciitis can cause mild irritation to severe pain that makes it difficult to walk or move. The pain is usually worse in the morning after sleeping, or after sitting or lying down for a period of time. Pain may also be worse after long periods of walking or standing. What are the causes? This condition may be caused by: Standing for long periods of time. Wearing shoes that do not have good arch support. Doing activities that put stress on joints (high-impact activities). This includes ballet and exercise that makes your heart beat faster (aerobic exercise), such as running. Being overweight. An abnormal way of walking (gait). Tight muscles in the back of your lower leg (calf). High arches in your feet or flat feet. Starting a new athletic activity. What are the signs or symptoms? The main symptom of this condition is heel pain. Pain may get worse after the following: Taking the first steps after a time of rest, especially in the morning after awakening, or after  you have been sitting or lying down for a while. Long periods of standing still. Pain may decrease after 30-45 minutes of activity, such as gentle walking. How is this diagnosed? This condition may be diagnosed based on your medical history, a physical exam, and your symptoms. Your health care provider will check for: A tender area on the bottom of your foot. A high arch in your foot or flat feet. Pain when you move your foot. Difficulty moving your foot. You may have imaging tests to confirm the diagnosis, such as: X-rays. Ultrasound. MRI. How is this treated? Treatment for plantar fasciitis depends on how severe your condition is. Treatment may include: Rest, ice, pressure (compression), and raising (elevating) the affected foot. This is called RICE therapy. Your  health care provider may recommend RICE therapy along with over-the-counter pain medicines to manage your pain. Exercises to stretch your calves and your plantar fascia. A splint that holds your foot in a stretched, upward position while you sleep (night splint). Physical therapy to relieve symptoms and prevent problems in the future. Injections of steroid medicine (cortisone) to relieve pain and inflammation. Stimulating your plantar fascia with electrical impulses (extracorporeal shock wave therapy). This is usually the last treatment option before surgery. Surgery, if other treatments have not worked after 12 months. Follow these instructions at home: Managing pain, stiffness, and swelling  If directed, put ice on the painful area. To do this: Put ice in a plastic bag, or use a frozen bottle of water. Place a towel between your skin and the bag or bottle. Roll the bottom of your foot over the bag or bottle. Do this for 20 minutes, 2-3 times a day. Wear athletic shoes that have air-sole or gel-sole cushions, or try soft shoe inserts that are designed for plantar fasciitis. Elevate your foot above the level of your heart while you are sitting or lying down. Activity Avoid activities that cause pain. Ask your health care provider what activities are safe for you. Do physical therapy exercises and stretches as told by your health care provider. Try activities and forms of exercise that are easier on your joints (low impact). Examples include swimming, water aerobics, and biking. General instructions Take over-the-counter and prescription medicines only as told by your health care provider. Wear a night splint while sleeping, if told by your health care provider. Loosen the splint if your toes tingle, become numb, or turn cold and blue. Maintain a healthy weight, or work with your health care provider to lose weight as needed. Keep all follow-up visits. This is important. Contact a health  care provider if you have: Symptoms that do not go away with home treatment. Pain that gets worse. Pain that affects your ability to move or do daily activities. Summary Plantar fasciitis is a painful foot condition that affects the heel. It occurs when the band of tissue that connects the toes to the heel bone (plantar fascia) becomes irritated. Heel pain is the main symptom of this condition. It may get worse after exercising too much or standing still for a long time. Treatment varies, but it usually starts with rest, ice, pressure (compression), and raising (elevating) the affected foot. This is called RICE therapy. Over-the-counter medicines can also be used to manage pain. This information is not intended to replace advice given to you by your health care provider. Make sure you discuss any questions you have with your health care provider. Document Revised: 12/24/2019 Document Reviewed: 12/24/2019 Elsevier Patient Education  2022  Elsevier Inc.    If you have been instructed to have an in-person evaluation today at a local Urgent Care facility, please use the link below. It will take you to a list of all of our available [facility], including address, phone number and hours of operation. Please do not delay care.  [facility]  If you or a family member do not have a primary care provider, use the link below to schedule a visit and establish care. When you choose a [facility] primary care physician or advanced practice provider, you gain a long-term partner in health. Find a Primary Care Provider  Learn more about 's in-office and virtual care options:  - Get Care Now

## 2021-11-05 NOTE — Progress Notes (Signed)
Tried to connect with patient via video prior to visit time and time of visit as patient had joined video early. She is asleep at time of video call -- tried to wake her up by speaking as loudly as possible, sending messages and attempting to call. She is still sleeping peacefully, snoring despite this. No one else coming to the video. Will see if she reschedules.

## 2021-11-05 NOTE — Progress Notes (Signed)
Virtual Visit Consent   Mary Conner, you are scheduled for a virtual visit with a [facility] provider today.     Just as with appointments in the office, your consent must be obtained to participate.  Your consent will be active for this visit and any virtual visit you may have with one of our providers in the next 365 days.     If you have a MyChart account, a copy of this consent can be sent to you electronically.  All virtual visits are billed to your insurance company just like a traditional visit in the office.    As this is a virtual visit, video technology does not allow for your provider to perform a traditional examination.  This may limit your provider's ability to fully assess your condition.  If your provider identifies any concerns that need to be evaluated in person or the need to arrange testing (such as labs, EKG, etc.), we will make arrangements to do so.     Although advances in technology are sophisticated, we cannot ensure that it will always work on either your end or our end.  If the connection with a video visit is poor, the visit may have to be switched to a telephone visit.  With either a video or telephone visit, we are not always able to ensure that we have a secure connection.     I need to obtain your verbal consent now.   Are you willing to proceed with your visit today?    Mary Conner has provided verbal consent on 11/05/2021 for a virtual visit (video or telephone).   Leeanne Rio, [place]   Date: 11/05/2021 2:54 PM   Virtual Visit via Video Note   I, Leeanne Rio, connected with  Mary Conner  ([id number], 1981/05/06) on 11/05/21 at  2:45 PM EST by a video-enabled telemedicine application and verified that I am speaking with the correct person using two identifiers.  Location: Patient: Virtual Visit Location Patient: Home Provider: Virtual Visit Location Provider: Home Office   I discussed the limitations of evaluation  and management by telemedicine and the availability of in person appointments. The patient expressed understanding and agreed to proceed.    History of Present Illness: Mary Conner is a 41 y.o. who identifies as a female who was assigned female at birth, and is being seen today for 2 weeks of pain in her R heel, worsening after a recent workout. Denies any true trauma or injury to the area. Noes pain with stepping on the foot that is intense and improves a bit after walking but becomes sore again with prolonged walking. Denies swelling or bruising. Denies pain in ankle. Denies symptoms of L foot.    HPI: HPI  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   Allergic rhinitis 03/17/2020   Encounter to establish care 11/02/2019   Obesity (BMI 30-39.9) 11/02/2019   Nail fungus 11/02/2019    Allergies: No Known Allergies Medications:  Current Outpatient Medications:    meloxicam (MOBIC) 15 MG tablet, Take 1 tablet (15 mg total) by mouth daily., Disp: 30 tablet, Rfl: 0   levocetirizine (XYZAL) 5 MG tablet, Take 1 tablet (5 mg total) by mouth every evening., Disp: 30 tablet, Rfl: 0   montelukast (SINGULAIR) 10 MG tablet, Take 1 tablet (10 mg total) by mouth daily., Disp: 90 tablet, Rfl: 1   Olopatadine HCl 0.2 %  SOLN, Apply 1 drop to eye daily., Disp: 2.5 mL, Rfl: 0  Observations/Objective: Patient is well-developed, well-nourished in no acute distress.  Resting comfortably at home.  Head is normocephalic, atraumatic.  No labored breathing. Speech is clear and coherent with logical content.  Patient is alert and oriented at baseline.    Assessment and Plan: 1. Pain of right heel - meloxicam (MOBIC) 15 MG tablet; Take 1 tablet (15 mg total) by mouth daily.  Dispense: 30 tablet; Refill: 0  Suspect plantar fasciitis. Start Mobic once daily. Tylenol for breakthrough pain. Cold can exercises, rehab stretches, compression  wrap discussed. Strict in person evaluation precautions reviewed with patient.   Follow Up Instructions: I discussed the assessment and treatment plan with the patient. The patient was provided an opportunity to ask questions and all were answered. The patient agreed with the plan and demonstrated an understanding of the instructions.  A copy of instructions were sent to the patient via MyChart unless otherwise noted below.   The patient was advised to call back or seek an in-person evaluation if the symptoms worsen or if the condition fails to improve as anticipated.  Time:  I spent 12 minutes with the patient via telehealth technology discussing the above problems/concerns.    Leeanne Rio, PA-C

## 2021-11-17 ENCOUNTER — Other Ambulatory Visit: Payer: Self-pay | Admitting: Physician Assistant

## 2021-11-17 ENCOUNTER — Other Ambulatory Visit (HOSPITAL_BASED_OUTPATIENT_CLINIC_OR_DEPARTMENT_OTHER): Payer: Self-pay

## 2021-11-17 ENCOUNTER — Other Ambulatory Visit: Payer: Self-pay

## 2021-11-17 DIAGNOSIS — J301 Allergic rhinitis due to pollen: Secondary | ICD-10-CM

## 2021-11-18 ENCOUNTER — Other Ambulatory Visit: Payer: Self-pay

## 2021-11-18 ENCOUNTER — Other Ambulatory Visit: Payer: Self-pay | Admitting: Physician Assistant

## 2021-11-18 DIAGNOSIS — J301 Allergic rhinitis due to pollen: Secondary | ICD-10-CM

## 2021-11-19 ENCOUNTER — Other Ambulatory Visit: Payer: Self-pay

## 2021-11-23 ENCOUNTER — Other Ambulatory Visit: Payer: Self-pay

## 2021-11-26 ENCOUNTER — Other Ambulatory Visit: Payer: Self-pay

## 2021-11-26 ENCOUNTER — Other Ambulatory Visit: Payer: Self-pay | Admitting: Physician Assistant

## 2021-11-26 DIAGNOSIS — J301 Allergic rhinitis due to pollen: Secondary | ICD-10-CM

## 2021-11-27 ENCOUNTER — Other Ambulatory Visit: Payer: Self-pay

## 2021-11-27 ENCOUNTER — Other Ambulatory Visit: Payer: Self-pay | Admitting: Physician Assistant

## 2021-11-27 DIAGNOSIS — J301 Allergic rhinitis due to pollen: Secondary | ICD-10-CM

## 2021-11-30 ENCOUNTER — Other Ambulatory Visit: Payer: Self-pay

## 2021-12-02 ENCOUNTER — Other Ambulatory Visit: Payer: Self-pay

## 2021-12-03 ENCOUNTER — Other Ambulatory Visit: Payer: Self-pay

## 2021-12-04 ENCOUNTER — Other Ambulatory Visit: Payer: Self-pay

## 2021-12-07 ENCOUNTER — Other Ambulatory Visit: Payer: Self-pay

## 2021-12-07 MED FILL — Levocetirizine Dihydrochloride Tab 5 MG: ORAL | 90 days supply | Qty: 90 | Fill #0 | Status: AC

## 2021-12-09 ENCOUNTER — Emergency Department: Payer: PRIVATE HEALTH INSURANCE

## 2021-12-09 ENCOUNTER — Encounter: Payer: Self-pay | Admitting: Emergency Medicine

## 2021-12-09 ENCOUNTER — Emergency Department
Admission: EM | Admit: 2021-12-09 | Discharge: 2021-12-09 | Disposition: A | Discharge status: Home / Self Care | Payer: PRIVATE HEALTH INSURANCE | Attending: Emergency Medicine | Admitting: Emergency Medicine

## 2021-12-09 ENCOUNTER — Other Ambulatory Visit: Payer: Self-pay

## 2021-12-09 DIAGNOSIS — S99921A Unspecified injury of right foot, initial encounter: Secondary | ICD-10-CM | POA: Diagnosis present

## 2021-12-09 DIAGNOSIS — S93601A Unspecified sprain of right foot, initial encounter: Secondary | ICD-10-CM | POA: Insufficient documentation

## 2021-12-09 DIAGNOSIS — X58XXXA Exposure to other specified factors, initial encounter: Secondary | ICD-10-CM | POA: Insufficient documentation

## 2021-12-09 MED ORDER — KETOROLAC TROMETHAMINE 30 MG/ML IJ SOLN
30.0000 mg | Freq: Once | INTRAMUSCULAR | Status: AC
  Administered 2021-12-09: 30 mg via INTRAMUSCULAR
  Filled 2021-12-09: qty 1

## 2021-12-09 MED ORDER — MELOXICAM 15 MG PO TABS: 15.0000 mg | ORAL_TABLET | Freq: Every day | ORAL | 0 refills | Status: DC

## 2021-12-09 NOTE — ED Provider Notes (Signed)
? ?[facility] ?Provider Note ? ?Patient Contact: 10:23 PM (approximate) ? ? ?History  ? ?Foot Injury ? ? ?HPI ? ?Mary Conner is a 41 y.o. female who presents the emergency department complaining of foot pain.  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  Patient states that she believes she has some mild plantar fasciitis at baseline, while she was working tonight she had to wrestle a combative patient and during the process she has injured her foot.  She states now the pain along the plantar aspect of her foot is so severe that she cannot bear weight.  She does not remember any definitive injury or a twisting mechanism.  Again pain is along the heel extending into the midfoot plantar aspect right foot.  No other injury or complaint.  No medications prior to arrival for this complaint. ?  ? ? ?Physical Exam  ? ?Triage Vital Signs: ?ED Triage Vitals  ?Enc Vitals Group  ?   BP 12/09/21 2209 (!) 170/120  ?   Pulse Rate 12/09/21 2209 95  ?   Resp 12/09/21 2209 18  ?   Temp 12/09/21 2209 98.2 ?F (36.8 ?C)  ?   Temp Source 12/09/21 2209 Oral  ?   SpO2 12/09/21 2209 97 %  ?   Weight 12/09/21 2208 240 lb (108.9 kg)  ?   Height 12/09/21 2208 5\' 5"  (1.651 m)  ?   Head Circumference --   ?   Peak Flow --   ?   Pain Score 12/09/21 2208 8  ?   Pain Loc --   ?   Pain Edu? --   ?   Excl. in [place]? --   ? ? ?Most recent vital signs: ?Vitals:  ? 12/09/21 2209  ?BP: (!) 170/120  ?Pulse: 95  ?Resp: 18  ?Temp: 98.2 ?F (36.8 ?C)  ?SpO2: 97%  ? ? ? ?General: Alert and in no acute distress.  ?Cardiovascular:  Good peripheral perfusion ?Respiratory: Normal respiratory effort without tachypnea or retractions. Lungs CTAB.  ?Musculoskeletal: Full range of motion to all extremities.  Visual right foot reveals no edema or open wounds.  Patient is tender to palpation along the heel extending into the midfoot.  No palpable abnormality in this region.  Pulses intact, capillary refill less than  2 seconds, sensation intact all digits. ?Neurologic:  No gross focal neurologic deficits are appreciated.  ?Skin:   No rash noted ?Other: ? ? ?ED Results / Procedures / Treatments  ? ?Labs ?(all labs ordered are listed, but only abnormal results are displayed) ?Labs Reviewed - No data to display ? ? ?EKG ? ? ? ? ?RADIOLOGY ? ?I personally viewed and evaluated these images as part of my medical decision making, as well as reviewing the written report by the radiologist. ? ?ED Provider Interpretation: No acute fracture or dislocation noted. ? ?DG Foot Complete Right ? ?Result Date: 12/09/2021 ?CLINICAL DATA:  Injury. EXAM: RIGHT FOOT COMPLETE - 3+ VIEW COMPARISON:  None. FINDINGS: There is no evidence of fracture or dislocation. There is no evidence of arthropathy or other focal bone abnormality. Small plantar calcaneal spur. Soft tissues are unremarkable. IMPRESSION: No fracture or dislocation of the right foot. Electronically Signed   By: Keith Rake M.D.   On: 12/09/2021 22:39   ? ?PROCEDURES: ? ?Critical Care performed: No ? ?Procedures ? ? ?MEDICATIONS ORDERED IN ED: ?Medications  ?ketorolac (TORADOL) 30 MG/ML injection 30 mg (has no administration  in time range)  ? ? ? ?IMPRESSION / MDM / ASSESSMENT AND PLAN / ED COURSE  ?I reviewed the triage vital signs and the nursing notes. ?             ?               ? ?Differential diagnosis includes, but is not limited to, calcaneal fracture, foot sprain, aggravation of plantar fasciitis ? ? ?Patient's diagnosis is consistent with foot strain.  Patient presented to the emergency department complaining of foot pain.  She reports that she had some mild plantar fasciitis symptoms prior to having to physically engage with a patient that was combative.  Patient had worsening pain to the plantar aspect of her foot.  I suspect that she has a strain of the plantar fascia on top of her mild plantar fasciitis.  At this point patient does wear a postop shoe, use  anti-inflammatories for symptom improvement.  Follow-up with podiatry to ensure symptoms are improving.  Work restrictions provided to the patient at this time.  Return precautions discussed with the patient..  Patient is given ED precautions to return to the ED for any worsening or new symptoms. ? ? ? ?  ? ? ?FINAL CLINICAL IMPRESSION(S) / ED DIAGNOSES  ? ?Final diagnoses:  ?Sprain of right foot, initial encounter  ? ? ? ?Rx / DC Orders  ? ?ED Discharge Orders   ? ?      Ordered  ?  meloxicam (MOBIC) 15 MG tablet  Daily       ? 12/09/21 2253  ? ?  ?  ? ?  ? ? ? ?Note:  This document was prepared using Dragon voice recognition software and may include unintentional dictation errors. ?  ?Darletta Moll, PA-C ?12/09/21 2255 ? ?  ?Carrie Mew, MD ?12/10/21 0031 ? ?

## 2021-12-09 NOTE — ED Triage Notes (Addendum)
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

## 2021-12-10 ENCOUNTER — Other Ambulatory Visit: Payer: Self-pay

## 2022-01-11 ENCOUNTER — Ambulatory Visit (INDEPENDENT_AMBULATORY_CARE_PROVIDER_SITE_OTHER): Payer: PRIVATE HEALTH INSURANCE | Admitting: Family Medicine

## 2022-01-11 DIAGNOSIS — M79671 Pain in right foot: Secondary | ICD-10-CM | POA: Diagnosis not present

## 2022-01-11 NOTE — Patient Instructions (Signed)
You strained your plantar fascia. ?Take tylenol and/or aleve as needed for pain  ?Start physical therapy and do home exercises on days you don't go to therapy. ?Ice heel for 15 minutes as needed. ?Avoid flat shoes/barefoot walking as much as possible. ?Arch straps have been shown to help with pain. ?Sports insoles with scaphoid pads in shoes - you can switch these in and out of different footwear. ?Follow up with me in 4 weeks. ?Continue with light duty in the meantime. ? ?

## 2022-01-12 ENCOUNTER — Encounter: Payer: Self-pay | Admitting: Family Medicine

## 2022-01-12 NOTE — Progress Notes (Signed)
PCP: Allegra Grana, FNP ? ?Subjective:  ? ?HPI: ?Patient is a 41 y.o. female here for right foot pain. ? 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 ?Was standing, pivoting, and after this felt fairly severe pain plantar right foot. ?She had a little bit of discomfort in plantar fascia before this but significantly worsened with this work incident. ?Has tried massage gun, meloxicam, postop shoe. ?Radiographs of foot were negative. ?Feels this is improving. ? ?History reviewed. No pertinent past medical history. ? ?Current Outpatient Medications on File Prior to Visit  ?Medication Sig Dispense Refill  ? levocetirizine (XYZAL) 5 MG tablet Take 1 tablet (5 mg total) by mouth every evening. 90 tablet 1  ? meloxicam (MOBIC) 15 MG tablet Take 1 tablet (15 mg total) by mouth daily. 30 tablet 0  ? meloxicam (MOBIC) 15 MG tablet Take 1 tablet (15 mg total) by mouth daily. 30 tablet 0  ? montelukast (SINGULAIR) 10 MG tablet Take 1 tablet (10 mg total) by mouth daily. 90 tablet 1  ? Olopatadine HCl 0.2 % SOLN Apply 1 drop to eye daily. 2.5 mL 0  ? ?No current facility-administered medications on file prior to visit.  ? ? ?History reviewed. No pertinent surgical history. ? ?No Known Allergies ? ?BP 135/76   Ht 5\' 5"  (1.651 m)   Wt 235 lb (106.6 kg)   BMI 39.11 kg/m?  ? ?   ? View : No data to display.  ?  ?  ?  ? ? ?   ? View : No data to display.  ?  ?  ?  ? ? ?    ?Objective:  ?Physical Exam: ? ?Gen: NAD, comfortable in exam room ? ?Right foot/ankle: ?No gross deformity, swelling, ecchymoses ?FROM ankle without pain ?TTP proximal plantar fascia including insertion on medial calcaneus. ?Negative ant drawer and negative talar tilt.   ?Negative calcaneal squeeze. ?Thompsons test negative. ?NV intact distally. ? ?Limited MSK u/s right foot: Proximal plantar fascia thickened at 0.81cm with hypoechoic  signal. ?  ?Assessment & Plan:  ?1. Right plantar fascia strain - sustained at work on 3/22.  Improving.  Icing, tylenol and/or aleve.  Start physical therapy and home exercises.  Arch supports and arch binders.  F/u in 4 weeks.  Light duty restrictions. ?

## 2022-01-21 ENCOUNTER — Ambulatory Visit: Payer: PRIVATE HEALTH INSURANCE | Attending: Family Medicine | Admitting: Physical Therapy

## 2022-01-21 ENCOUNTER — Encounter: Payer: Self-pay | Admitting: Physical Therapy

## 2022-01-21 DIAGNOSIS — M79671 Pain in right foot: Secondary | ICD-10-CM | POA: Diagnosis present

## 2022-01-21 NOTE — Therapy (Signed)
[place] ?[facility] ?[address] ?[place], [place], [address] ?Phone: [phone]   Fax:  [phone] ? ?Physical Therapy Evaluation ? ?Patient Details  ?Name: Mary Conner ?MRN: [id number] ?Date of Birth: 09/02/1981 ?No data recorded ? ?Encounter Date: 01/21/2022 ? ? PT End of Session - 01/21/22 1818   ? ? Visit Number 1   ? Number of Visits 13   ? Date for PT Re-Evaluation 03/04/22   ? Progress Note Due on Visit 10   ? PT Start Time 1301   ? PT Stop Time 1345   ? PT Time Calculation (min) 44 min   ? Activity Tolerance Patient tolerated treatment well   ? Behavior [facility] tasks assessed/performed   ? ?  ?  ? ?  ? ? ?History reviewed. No pertinent past medical history. ? ?History reviewed. No pertinent surgical history. ? ?There were no vitals filed for this visit. ? ? ? Subjective Assessment - 01/21/22 1757   ? ? Subjective right foot pain   ? 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 She reports having mild pain in heel prior to this instance for about 1 week however pain became severely worse after. She wore a post-op shoe for ~2 weeks due to severe pain with weightbearing through the heel. She is now wearing OTC orthotics that were recommended at the sports clinic in [place]. Pain is felt primarily in medial longitudinal arch (per pt description). Initially most significant pain was in the heel; she has not felt pain in her heel for a few weeks now. No longer taking pain meds. Pain no longer disrupts sleep. She has been limited in gym routine; her goal is to get back into the gym. 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   ? How long can you sit comfortably? unlimited   ? How long can you stand comfortably? a couple hours   ? How long can you walk comfortably? 1 hour   ?  Patient Stated Goals to get back into the gym   ? Currently in Pain? Yes   ? Pain Score 2    ? Pain Location Foot   ? Pain Orientation Right;Lower   ? Pain Descriptors / Indicators Other (Comment)   pulling/stretching  ? Pain Type Acute pain   ? Pain Onset More than a month ago   ? Pain Frequency Occasional   ? Aggravating Factors  walking barefoot   ? Pain Relieving Factors wearing slippers, orthotics, rest, rolling foot on water bottle   ? ?  ?  ? ?  ? ? ? 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 She reports having mild pain in heel prior to this instance for about 1 week however pain became severely worse after. She wore a post-op shoe for ~2 weeks due to severe pain with weightbearing through the heel. She is now wearing OTC orthotics that were recommended at the sports clinic in [place]. Pain is felt primarily in medial longitudinal arch (per pt description). Initially most significant pain was in the heel; she has not felt pain in her heel for a few weeks now. No longer taking pain meds.  Pain no longer disrupts sleep. She has been limited in gym routine; her goal is to get back into the gym. 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 ? ?History: no prior injuries. ?Referring Dx: right foot pain ?Referring Provider: Norton Blizzard, MD ?Pain location: right longitudinal arch in foot  ?Pain: Present 2/10, Best 0/10, Worst 4/10 ?Pain quality: pulling/stretching, cramping, not sharp  ?Radiating pain: No - did at initial injury  ?Numbness/Tingling: No ?24 hour pain behavior: morning is the best, at end of work shift is the worst   ?Aggravating factors: walking barefoot  ?Easing factors: wearing slippers, orthotics, rest, rolling foot on water bottle  ?History of back, hip, or knee pain: No ?Precautions/WB Restrictions: No ?Next MD Visit: May  22nd   ?Follow-up appointment with MD: Yes ?Imaging: Yes - x-ray negative for fracture or dislocation ?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 ? ?OBJECTIVE ? ?MUSCULOSKELETAL: ?Bulk: Normal ?No trophic changes noted to foot/ankle. No ecchymosis, erythema, or edema noted. No gross ankle/foot deformity noted ? ?Lumbar/Hip/Knee Screen ?AROM: WFL and painless with overpressure in all planes ? ?Posture ?No gross abnormalities noted in seated or standing posture ?Mild pronation bilaterally; increased toe out on right compared to left (both mild angles).  ? ?Gait ?Mild pronation noted during gait w/o shoes.  ? ?Palpation ?No pain to palpation along aches of the right foot, calcaneous, Achilles tendon or gastroc.  ? ?Strength ?Full strength and ROM bilaterally in hip, knee and ankle excluding PF in which pt is 4/5 bilaterally (able to complete all reps however looses ROM mid-set). ? ? ?NEUROLOGICAL: ? ?Sensation ?Grossly intact to light touch bilateral LEs as determined by testing dermatomes L2-S2. ? ? ?INTERVENTIONS ?-rolling R arches on lacrosse ball, x2 minutes ?-calf stretch on first step, RLE 3x30 sec ? ? ?ASSESSMENT ?Pt is a pleasant 41 year-old female referred for right foot pain. She reports pain has improved significantly since initial injury. PT examination reveals very few deficits; mild pronation in standing and ambulation without shoes and 4/5 MMT in PF. Main limitation seems to be prolonged WB when at work. Unable to provoke pain upon palpation however she did respond well to rolling arches on a lacrosse ball and stretching gastroc. Consider including intrinsic foot strengthening next session. Pt will benefit from PT services to address pain with prolonged standing in order to better function  at work and return to the gym.   ? ? ?PLAN ?Next visit: rolling, intrinsic strengthening, gastroc stretch, review exercises she can begin performing at gym  ?HEP: gastroc stretch, roll on lacrosse ball  ? ? ? ? ? ? ?Objective measurements completed on examination: See above findings.  ? ? ? PT Short Term Goals - 01/21/22 1820   ? ?  ? PT SHORT TERM GOAL #1  ? Title Pt will be independent with HEP in order to decrease ankle pain and increase strength in order to improve pain-free function at home and work.   ? Time 2   ? Period Weeks   ? Status New   ? Target Date 02/04/22   ? ?  ?  ? ?  ? ? ? PT Long Term Goals - 01/21/22 1820   ? ?  ? PT LONG TERM GOAL #1  ? Title Patient  will increase FOTO score to equal to or greater than 77 to demonstrate statistically significant improvement in mobility and quality of life.   ? Baseline 01/21/22: 72   ? Time 6   ? Period Weeks   ? Status New   ? Target Date 03/04/22   ?  ? PT LONG TERM GOAL #2  ? Title Pt will decrease worst pain as reported on NPRS by at least 3 points in order to demonstrate clinically significant reduction in ankle/foot pain.   ? Baseline 01/21/22: 4/10   ? Time 6   ? Period Weeks   ? Status New   ? Target Date 03/04/22   ? ?  ?  ? ?  ? ? ? ? ? ? Plan - 01/21/22 1818   ? ? Clinical Impression Statement Pt is a pleasant 41 year-old female referred for right foot pain. She reports pain has improved significantly since initial injury. PT examination reveals very few deficits; mild pronation in standing and ambulation without shoes and 4/5 MMT in PF. Main limitation seems to be prolonged WB when at work. Unable to provoke pain upon palpation however she did respond well to rolling arches on a lacrosse ball and stretching gastroc. Consider including intrinsic foot strengthening next session. Pt will benefit from PT services to address pain with prolonged standing in order to better function at work and return to the gym.   ? Personal Factors and Comorbidities Time  since onset of injury/illness/exacerbation;Profession   ? Examination-Activity Limitations Stairs;Locomotion Level;Stand   ? Examination-Participation Restrictions Community Activity;Occupation   ? Stability/

## 2022-01-25 ENCOUNTER — Encounter: Payer: No Typology Code available for payment source | Admitting: Physical Therapy

## 2022-01-27 ENCOUNTER — Ambulatory Visit: Payer: Worker's Compensation | Attending: Family Medicine | Admitting: Physical Therapy

## 2022-01-28 ENCOUNTER — Telehealth: Payer: No Typology Code available for payment source | Admitting: Physician Assistant

## 2022-01-28 DIAGNOSIS — E669 Obesity, unspecified: Secondary | ICD-10-CM

## 2022-01-28 NOTE — Progress Notes (Signed)
Patient arrived to visit but left before her evaluation. Resent links but patient still not reconnecting, even 10 minutes after appointment time. Will just mark as a no-charge since a message was sent to her earlier to let her know we do not provide weight loss medications through our virtual urgent care and that she may want to reach out to her PCP.  ?

## 2022-01-29 ENCOUNTER — Encounter: Payer: No Typology Code available for payment source | Admitting: Physical Therapy

## 2022-02-03 ENCOUNTER — Encounter: Payer: No Typology Code available for payment source | Admitting: Physical Therapy

## 2022-02-08 ENCOUNTER — Ambulatory Visit (INDEPENDENT_AMBULATORY_CARE_PROVIDER_SITE_OTHER): Payer: PRIVATE HEALTH INSURANCE | Admitting: Family Medicine

## 2022-02-08 VITALS — BP 122/88 | Ht 65.0 in | Wt 230.0 lb

## 2022-02-08 DIAGNOSIS — M79671 Pain in right foot: Secondary | ICD-10-CM

## 2022-02-08 NOTE — Patient Instructions (Signed)
Continue with the inserts at work for 6 more weeks. Do home exercises/stretches also for 6 more weeks even if the pain is completely gone. Return to work full duty without restrictions. Follow up with me as needed.

## 2022-02-09 ENCOUNTER — Encounter: Payer: Self-pay | Admitting: Family Medicine

## 2022-02-09 NOTE — Progress Notes (Signed)
PCP: Allegra Grana, FNP  Subjective:   HPI: Patient is a 41 y.o. female here for right foot pain.  4/24: Patient reports on 3/22 while at work she was transporting an aggressive patient and trying to keep them in the bed for 20 minutes wrestling with the patient. Was standing, pivoting, and after this felt fairly severe pain plantar right foot. She had a little bit of discomfort in plantar fascia before this but significantly worsened with this work incident. Has tried massage gun, meloxicam, postop shoe. Radiographs of foot were negative. Feels this is improving.  5/22: Patient reports she is doing very well. Only with some mild soreness right when she wakes up that resolves quickly. Did one visit of physical therapy and has been doing home exercises. Wears arch supports at work. No other issues.  History reviewed. No pertinent past medical history.  Current Outpatient Medications on File Prior to Visit  Medication Sig Dispense Refill   levocetirizine (XYZAL) 5 MG tablet Take 1 tablet (5 mg total) by mouth every evening. 90 tablet 1   meloxicam (MOBIC) 15 MG tablet Take 1 tablet (15 mg total) by mouth daily. 30 tablet 0   meloxicam (MOBIC) 15 MG tablet Take 1 tablet (15 mg total) by mouth daily. 30 tablet 0   montelukast (SINGULAIR) 10 MG tablet Take 1 tablet (10 mg total) by mouth daily. 90 tablet 1   Olopatadine HCl 0.2 % SOLN Apply 1 drop to eye daily. 2.5 mL 0   No current facility-administered medications on file prior to visit.    History reviewed. No pertinent surgical history.  No Known Allergies  BP 122/88   Ht 5\' 5"  (1.651 m)   Wt 230 lb (104.3 kg)   BMI 38.27 kg/m       View : No data to display.              View : No data to display.              Objective:  Physical Exam:  Gen: NAD, comfortable in exam room  Right foot/ankle: No gross deformity, swelling, ecchymoses FROM No TTP Negative calcaneal squeeze. NV intact distally.    Assessment & Plan:  1. Right plantar fascia strain - Sustained at work 3/22.  Significantly improved with arch supports and home exercises.  Can discontinue physical therapy.  Return to work full duty without restrictions.

## 2022-02-10 ENCOUNTER — Encounter: Payer: No Typology Code available for payment source | Admitting: Physical Therapy

## 2022-02-12 ENCOUNTER — Encounter: Payer: No Typology Code available for payment source | Admitting: Physical Therapy

## 2022-02-16 ENCOUNTER — Encounter: Payer: No Typology Code available for payment source | Admitting: Physical Therapy

## 2022-02-18 ENCOUNTER — Encounter: Payer: No Typology Code available for payment source | Admitting: Physical Therapy

## 2022-02-21 ENCOUNTER — Telehealth: Payer: No Typology Code available for payment source | Admitting: Nurse Practitioner

## 2022-02-21 DIAGNOSIS — H938X3 Other specified disorders of ear, bilateral: Secondary | ICD-10-CM | POA: Diagnosis not present

## 2022-02-21 DIAGNOSIS — R42 Dizziness and giddiness: Secondary | ICD-10-CM | POA: Diagnosis not present

## 2022-02-21 MED ORDER — MECLIZINE HCL 25 MG PO TABS: 25.0000 mg | ORAL_TABLET | Freq: Three times a day (TID) | ORAL | 0 refills | Status: DC | PRN

## 2022-02-21 MED ORDER — FLUTICASONE PROPIONATE 50 MCG/ACT NA SUSP: 2.0000 | Freq: Every day | NASAL | 0 refills | Status: DC

## 2022-02-21 NOTE — Progress Notes (Signed)
   We are sorry that you are not feeling well. Here is how we plan to help!  I have sent a steroid nose spray to help with your current symptoms. It does not appear you actually have the majority of symptoms of swimmer's ear at this time. Please see below for swimmer's ear symptoms.   Redness of the outer ear. An itch in the ear. Pain, often when touching or wiggling your earlobe. Pus draining from your ear. ... Swollen glands in your neck. Swollen ear canal. Muffled hearing or hearing loss.   In certain cases swimmer's ear may progress to a more serious bacterial infection of the middle or inner ear.  If you have a fever 102 and up and significantly worsening symptoms, this could indicate a more serious infection moving to the middle/inner and needs face to face evaluation in an office by a provider.  Your symptoms should improve over the next 3 days and should resolve in about 7 days.  HOME CARE:  Wash your hands frequently. Do not place the tip of the bottle on your ear or touch it with your fingers. You can take Acetominophen 650 mg every 4-6 hours as needed for pain.  If pain is severe or moderate, you can apply a heating pad (set on low) or hot water bottle (wrapped in a towel) to outer ear for 20 minutes.  This will also increase drainage. Avoid ear plugs Do not use Q-tips After showers, help the water run out by tilting your head to one side.  GET HELP RIGHT AWAY IF:  Fever is over 102.2 degrees. You develop progressive ear pain or hearing loss. Ear symptoms persist longer than 3 days after treatment.  MAKE SURE YOU:  Understand these instructions. Will watch your condition. Will get help right away if you are not doing well or get worse.  TO PREVENT SWIMMER'S EAR: Use a bathing cap or custom fitted swim molds to keep your ears dry. Towel off after swimming to dry your ears. Tilt your head or pull your earlobes to allow the water to escape your ear canal. If there  is still water in your ears, consider using a hairdryer on the lowest setting.   Thank you for choosing an e-visit.  Your e-visit answers were reviewed by a board certified advanced clinical practitioner to complete your personal care plan. Depending upon the condition, your plan could have included both over the counter or prescription medications.  Please review your pharmacy choice. Make sure the pharmacy is open so you can pick up prescription now. If there is a problem, you may contact your provider through [employer] and have the prescription routed to another pharmacy.  Your safety is important to [place]. If you have drug allergies check your prescription carefully.   For the next 24 hours you can use MyChart to ask questions about today's visit, request a non-urgent call back, or ask for a work or school excuse. You will get an email in the next two days asking about your experience. I hope that your e-visit has been valuable and will speed your recovery.

## 2022-02-21 NOTE — Addendum Note (Signed)
Addended by: Bertram Denver on: 02/21/2022 06:08 PM   Modules accepted: Orders

## 2022-02-22 ENCOUNTER — Ambulatory Visit
Admission: RE | Admit: 2022-02-22 | Discharge: 2022-02-22 | Discharge status: Against Medical Advice | Payer: No Typology Code available for payment source | Source: Ambulatory Visit | Attending: Family | Admitting: Family

## 2022-03-29 ENCOUNTER — Telehealth: Payer: No Typology Code available for payment source

## 2022-04-12 ENCOUNTER — Other Ambulatory Visit: Payer: Self-pay

## 2022-04-12 MED FILL — Levocetirizine Dihydrochloride Tab 5 MG: ORAL | 90 days supply | Qty: 90 | Fill #1 | Status: AC

## 2022-04-17 ENCOUNTER — Telehealth: Payer: Self-pay | Admitting: Urgent Care

## 2022-04-17 DIAGNOSIS — S29012A Strain of muscle and tendon of back wall of thorax, initial encounter: Secondary | ICD-10-CM

## 2022-04-17 MED ORDER — TIZANIDINE HCL 2 MG PO CAPS: 2.0000 mg | ORAL_CAPSULE | Freq: Three times a day (TID) | ORAL | 0 refills | Status: DC | PRN

## 2022-04-17 MED ORDER — ETODOLAC 300 MG PO CAPS
300.0000 mg | ORAL_CAPSULE | Freq: Two times a day (BID) | ORAL | 0 refills | Status: DC
Start: 2022-04-17 — End: 2022-09-01

## 2022-04-17 NOTE — Progress Notes (Signed)
We are sorry that you are not feeling well.  Here is how we plan to help!  Based on what you have shared with me it looks like you mostly have acute back pain, most likely a trapezius or rhomboid strain given the location and mechanism of injury.  Acute back pain is defined as musculoskeletal pain that can resolve in 1-3 weeks with conservative treatment.  I have prescribed Etodolac 300 mg take one by mouth twice a day non-steroid anti-inflammatory (NSAID) as well as Tizanidine 2 mg every eight hours as needed which is a muscle relaxer  Some patients experience stomach irritation or in increased heartburn with anti-inflammatory drugs.  Please keep in mind that muscle relaxer's can cause fatigue and should not be taken while at work or driving.  Back pain is very common.  The pain often gets better over time.  The cause of back pain is usually not dangerous.  Most people can learn to manage their back pain on their own.  Home Care Stay active.  Start with short walks on flat ground if you can.  Try to walk farther each day. Do not sit, drive or stand in one place for more than 30 minutes.  Do not stay in bed. Do not avoid exercise or work.  Activity can help your back heal faster. Be careful when you bend or lift an object.  Bend at your knees, keep the object close to you, and do not twist. Sleep on a firm mattress.  Lie on your side, and bend your knees.  If you lie on your back, put a pillow under your knees. Only take medicines as told by your doctor. Put ice on the injured area. Put ice in a plastic bag Place a towel between your skin and the bag Leave the ice on for 15-20 minutes, 3-4 times a day for the first 2-3 days. 210 After that, you can switch between ice and heat packs. Ask your doctor about back exercises or massage. Avoid feeling anxious or stressed.  Find good ways to deal with stress, such as exercise.  Get Help Right Way If: Your pain does not go away with rest or  medicine. Your pain does not go away in 1 week. You have new problems. You do not feel well. The pain spreads into your legs. You cannot control when you poop (bowel movement) or pee (urinate) You feel sick to your stomach (nauseous) or throw up (vomit) You have belly (abdominal) pain. You feel like you may pass out (faint). If you develop a fever.  Make Sure you: Understand these instructions. Will watch your condition Will get help right away if you are not doing well or get worse.  Your e-visit answers were reviewed by a board certified advanced clinical practitioner to complete your personal care plan.  Depending on the condition, your plan could have included both over the counter or prescription medications.  If there is a problem please reply  once you have received a response from your provider.  Your safety is important to [place].  If you have drug allergies check your prescription carefully.    You can use MyChart to ask questions about today's visit, request a non-urgent call back, or ask for a work or school excuse for 24 hours related to this e-Visit. If it has been greater than 24 hours you will need to follow up with your provider, or enter a new e-Visit to address those concerns.  You will get an  e-mail in the next two days asking about your experience.  I hope that your e-visit has been valuable and will speed your recovery. Thank you for using e-visits.   I have spent 5 minutes in review of e-visit questionnaire, review and updating patient chart, medical decision making and response to patient.   Halford Goetzke L Bryann Gentz, PA

## 2022-05-04 NOTE — Progress Notes (Deleted)
New patient visit   Patient: Mary Conner   DOB: September 28, 1980   41 y.o. Female  MRN: [id number] Visit Date: 05/06/2022  Today's healthcare provider: Jacky Kindle, FNP   No chief complaint on file.  Subjective    Mary Conner is a 40 y.o. female who presents today as a new patient to establish care.  HPI  ***  No past medical history on file. No past surgical history on file. No family status information on file.   No family history on file. Social History   Socioeconomic History   Marital status: Single    Spouse name: Not on file   Number of children: Not on file   Years of education: Not on file   Highest education level: Not on file  Occupational History   Not on file  Tobacco Use   Smoking status: Never   Smokeless tobacco: Never  Vaping Use   Vaping Use: Never used  Substance and Sexual Activity   Alcohol use: Yes    Comment: occasionally   Drug use: No   Sexual activity: Not on file  Other Topics Concern   Not on file  Social History Narrative   Grew up in [place]   Works at security for [employer]   Has 3 kids, boyfriend   Mother lives with them   Social Determinants of Health   Financial Resource Strain: Not on file  Food Insecurity: Not on file  Transportation Needs: Not on file  Physical Activity: Not on file  Stress: Not on file  Social Connections: Not on file   Outpatient Medications Prior to Visit  Medication Sig   etodolac (LODINE) 300 MG capsule Take 1 capsule (300 mg total) by mouth 2 (two) times daily with a meal.   fluticasone (FLONASE) 50 MCG/ACT nasal spray Place 2 sprays into both nostrils daily.   levocetirizine (XYZAL) 5 MG tablet Take 1 tablet (5 mg total) by mouth every evening.   meclizine (ANTIVERT) 25 MG tablet Take 1 tablet (25 mg total) by mouth 3 (three) times daily as needed for dizziness.   tizanidine (ZANAFLEX) 2 MG capsule Take 1 capsule (2 mg total) by mouth 3 (three) times daily as needed for  muscle spasms.   No facility-administered medications prior to visit.   No Known Allergies  Immunization History  Administered Date(s) Administered   Influenza-Unspecified 04/30/2019   PFIZER(Purple Top)SARS-COV-2 Vaccination 09/17/2019, 10/08/2019    Health Maintenance  Topic Date Due   Hepatitis C Screening  Never done   TETANUS/TDAP  Never done   PAP SMEAR-Modifier  Never done   COVID-19 Vaccine (5 - Pfizer series) 08/28/2020   INFLUENZA VACCINE  04/20/2022   HIV Screening  Completed   HPV VACCINES  Aged Out    Patient Care Team: Pcp, No as PCP - General  Review of Systems  {Labs  Heme  Chem  Endocrine  Serology  Results Review (optional):23779}   Objective    There were no vitals taken for this visit. {Show previous vital signs (optional):23777}  Physical Exam ***  Depression Screen    03/17/2020   11:54 AM 11/02/2019    9:16 AM  PHQ 2/9 Scores  PHQ - 2 Score 0 0   No results found for any visits on 05/06/22.  Assessment & Plan     ***  No follow-ups on file.     {provider attestation***:1}   Jacky Kindle, FNP  [facility] [phone] (phone)  [phone] (fax)  [place]

## 2022-05-06 ENCOUNTER — Ambulatory Visit: Payer: Self-pay | Admitting: Physician Assistant

## 2022-05-06 DIAGNOSIS — D619 Aplastic anemia, unspecified: Secondary | ICD-10-CM

## 2022-05-06 DIAGNOSIS — E669 Obesity, unspecified: Secondary | ICD-10-CM

## 2022-05-06 DIAGNOSIS — J301 Allergic rhinitis due to pollen: Secondary | ICD-10-CM

## 2022-05-06 DIAGNOSIS — Z7689 Persons encountering health services in other specified circumstances: Secondary | ICD-10-CM

## 2022-07-07 NOTE — Progress Notes (Deleted)
New patient visit   Patient: Mary Conner   DOB: [birth year]   41 y.o. Female  MRN: [id number] Visit Date: 07/09/2022  Today's healthcare provider: Ronnald Ramp, MD   No chief complaint on file.  Subjective    Mary Conner is a 41 y.o. female who presents today as a new patient to establish care.  HPI  Obesity: Patient complains of obesity. Patient cites {motive:16538} as reasons for wanting to lose weight.  Obesity History Period of greatest weight gain: *** lb during {age at onset:[age]} Lowest adult weight: *** Highest adult weight: ***   History of Weight Loss Efforts Greatest amount of weight lost: *** lb over *** months Amount of time that loss was maintained: *** months Circumstances associated with regain of weight: *** Successful weight loss techniques attempted: {obesity treatment:12513} Unsuccessful weight loss techniques attempted: {obesity treatment:12513}  Current Exercise Habits {exercise types:16438}  Current Eating Habits Number of regular meals per day: {0-10:33138} Number of snacking episodes per day: {0-10:33138} Who shops for food? {companion:5061::"patient"} Who prepares food? {companion:5061::"patient"} Who eats with patient? {companion:5061::"patient"} Binge behavior?: {yes***/no:17258} Purge behavior? {yes***/no:17258} Anorexic behavior? {yes***/no:17258} Eating precipitated by stress? {yes***/no:17258} Guilt feelings associated with eating? {yes***/no:17258}  Other Potential Contributing Factors Use of alcohol: average *** drinks/week History of past abuse? {abuse type:16542} Psych History: {ros - neuro psych list:32387}   No past medical history on file. No past surgical history on file. No family status information on file.   No family history on file. Social History   Socioeconomic History   Marital status: Single    Spouse name: Not on file   Number of children: Not on file   Years of education: Not  on file   Highest education level: Not on file  Occupational History   Not on file  Tobacco Use   Smoking status: Never   Smokeless tobacco: Never  Vaping Use   Vaping Use: Never used  Substance and Sexual Activity   Alcohol use: Yes    Comment: occasionally   Drug use: No   Sexual activity: Not on file  Other Topics Concern   Not on file  Social History Narrative   Grew up in [place]   Works at security for [employer]   Has 3 kids, boyfriend   Mother lives with them   Social Determinants of Health   Financial Resource Strain: Not on file  Food Insecurity: Not on file  Transportation Needs: Not on file  Physical Activity: Not on file  Stress: Not on file  Social Connections: Not on file   Outpatient Medications Prior to Visit  Medication Sig   etodolac (LODINE) 300 MG capsule Take 1 capsule (300 mg total) by mouth 2 (two) times daily with a meal.   fluticasone (FLONASE) 50 MCG/ACT nasal spray Place 2 sprays into both nostrils daily.   levocetirizine (XYZAL) 5 MG tablet Take 1 tablet (5 mg total) by mouth every evening.   meclizine (ANTIVERT) 25 MG tablet Take 1 tablet (25 mg total) by mouth 3 (three) times daily as needed for dizziness.   tizanidine (ZANAFLEX) 2 MG capsule Take 1 capsule (2 mg total) by mouth 3 (three) times daily as needed for muscle spasms.   No facility-administered medications prior to visit.   No Known Allergies  Immunization History  Administered Date(s) Administered   Influenza-Unspecified 04/30/2019   PFIZER(Purple Top)SARS-COV-2 Vaccination 09/17/2019, 10/08/2019    Health Maintenance  Topic Date Due   Hepatitis C Screening  Never  done   TETANUS/TDAP  Never done   PAP SMEAR-Modifier  Never done   COVID-19 Vaccine (4 - Pfizer series) 08/28/2020   INFLUENZA VACCINE  04/20/2022   HIV Screening  Completed   HPV VACCINES  Aged Out    Patient Care Team: Pcp, No as PCP - General  Review of Systems  {Labs  Heme  Chem  Endocrine   Serology  Results Review (optional):23779}   Objective    There were no vitals taken for this visit. {Show previous vital signs (optional):23777}  Physical Exam ***  Depression Screen    03/17/2020   11:54 AM 11/02/2019    9:16 AM  PHQ 2/9 Scores  PHQ - 2 Score 0 0   No results found for any visits on 07/09/22.  Assessment & Plan      Problem List Items Addressed This Visit   None    No follow-ups on file.     I, Eulis Foster, MD, have reviewed all documentation for this visit. The documentation on 07/09/22 for the exam, diagnosis, procedures, and orders are all accurate and complete.    Eulis Foster, MD  [facility] [phone] (phone) [phone] (fax)  [place]

## 2022-07-09 ENCOUNTER — Ambulatory Visit: Payer: Medicaid Other | Admitting: Family Medicine

## 2022-08-24 ENCOUNTER — Telehealth: Payer: No Typology Code available for payment source | Admitting: Physician Assistant

## 2022-08-24 DIAGNOSIS — J310 Chronic rhinitis: Secondary | ICD-10-CM

## 2022-08-24 MED ORDER — MONTELUKAST SODIUM 10 MG PO TABS: 10.0000 mg | ORAL_TABLET | Freq: Every day | ORAL | 0 refills | Status: DC

## 2022-08-24 NOTE — Progress Notes (Signed)
I have spent 5 minutes in review of e-visit questionnaire, review and updating patient chart, medical decision making and response to patient.   Mary Conner Cody Gari Hartsell, PA-C    

## 2022-08-24 NOTE — Progress Notes (Signed)
E visit for Allergic Rhinitis We are sorry that you are not feeling well.  Here is how we plan to help!  Based on what you have shared with me it looks like you have Allergic Rhinitis.  Rhinitis is when a reaction occurs that causes nasal congestion, runny nose, sneezing, and itching.  Most types of rhinitis are caused by an inflammation and are associated with symptoms in the eyes ears or throat. There are several types of rhinitis.  The most common are acute rhinitis, which is usually caused by a viral illness, allergic or seasonal rhinitis, and nonallergic or year-round rhinitis.  Nasal allergies occur certain times of the year.  Allergic rhinitis is caused when allergens in the air trigger the release of histamine in the body.  Histamine causes itching, swelling, and fluid to build up in the fragile linings of the nasal passages, sinuses and eyelids.  An itchy nose and clear discharge are common.  I recommend the following over the counter treatments: You should take a daily dose of antihistamine - I would recommend Allegra 60 mg twice daily I also would recommend you continue your steroid nasal spray I am going to give you a trial of a prescription allergy medication, Singulair, to use daily as directed. Ok to take this along with the other allergy medications above.     HOME CARE:  You can use an over-the-counter saline nasal spray as needed Avoid areas where there is heavy dust, mites, or molds Stay indoors on windy days during the pollen season Keep windows closed in home, at least in bedroom; use air conditioner. Use high-efficiency house air filter Keep windows closed in car, turn AC on re-circulate Avoid playing out with dog during pollen season  GET HELP RIGHT AWAY IF:  If your symptoms do not improve within 10 days You become short of breath You develop yellow or green discharge from your nose for over 3 days You have coughing fits  MAKE SURE YOU:  Understand these  instructions Will watch your condition Will get help right away if you are not doing well or get worse  Thank you for choosing an e-visit. Your e-visit answers were reviewed by a board certified advanced clinical practitioner to complete your personal care plan. Depending upon the condition, your plan could have included both over the counter or prescription medications. Please review your pharmacy choice. Be sure that the pharmacy you have chosen is open so that you can pick up your prescription now.  If there is a problem you may message your provider in MyChart to have the prescription routed to another pharmacy. Your safety is important to [place]. If you have drug allergies check your prescription carefully.  For the next 24 hours, you can use MyChart to ask questions about today's visit, request a non-urgent call back, or ask for a work or school excuse from your e-visit provider. You will get an email in the next two days asking about your experience. I hope that your e-visit has been valuable and will speed your recovery.

## 2022-09-01 ENCOUNTER — Telehealth: Payer: No Typology Code available for payment source | Admitting: Physician Assistant

## 2022-09-01 DIAGNOSIS — J301 Allergic rhinitis due to pollen: Secondary | ICD-10-CM

## 2022-09-01 DIAGNOSIS — M25549 Pain in joints of unspecified hand: Secondary | ICD-10-CM

## 2022-09-01 MED ORDER — LEVOCETIRIZINE DIHYDROCHLORIDE 5 MG PO TABS: 5.0000 mg | ORAL_TABLET | Freq: Every evening | ORAL | 1 refills | Status: DC

## 2022-09-01 MED ORDER — DICLOFENAC SODIUM 1 % EX GEL: 2.0000 g | Freq: Four times a day (QID) | CUTANEOUS | 0 refills | Status: DC

## 2022-09-01 NOTE — Progress Notes (Signed)
Virtual Visit Consent   Mary Conner, you are scheduled for a virtual visit with a [facility] provider today. Just as with appointments in the office, your consent must be obtained to participate. Your consent will be active for this visit and any virtual visit you may have with one of our providers in the next 365 days. If you have a MyChart account, a copy of this consent can be sent to you electronically.  As this is a virtual visit, video technology does not allow for your provider to perform a traditional examination. This may limit your provider's ability to fully assess your condition. If your provider identifies any concerns that need to be evaluated in person or the need to arrange testing (such as labs, EKG, etc.), we will make arrangements to do so. Although advances in technology are sophisticated, we cannot ensure that it will always work on either your end or our end. If the connection with a video visit is poor, the visit may have to be switched to a telephone visit. With either a video or telephone visit, we are not always able to ensure that we have a secure connection.  By engaging in this virtual visit, you consent to the provision of healthcare and authorize for your insurance to be billed (if applicable) for the services provided during this visit. Depending on your insurance coverage, you may receive a charge related to this service.  I need to obtain your verbal consent now. Are you willing to proceed with your visit today? Mary Conner has provided verbal consent on 09/01/2022 for a virtual visit (video or telephone). [name], [place]  Date: 09/01/2022 5:00 PM  Virtual Visit via Video Note   I, [name], connected with  Mary Conner  ([id number], 42-Jun-1982) on 09/01/22 at  4:30 PM EST by a video-enabled telemedicine application and verified that I am speaking with the correct person using two identifiers.  Location: Patient:  Virtual Visit Location Patient: Home Provider: Virtual Visit Location Provider: Home Office   I discussed the limitations of evaluation and management by telemedicine and the availability of in person appointments. The patient expressed understanding and agreed to proceed.    History of Present Illness: Mary Conner is a 41 y.o. who identifies as a female who was assigned female at birth, and is being seen today for pain in the PIP of her right third finger over the past 2 days.  Denies trauma or injury.  Denies redness or warmth.  Sometimes feels mildly swollen or tight.  Pain with movement of the finger, especially when she is "swiping" on her phone.  Denies similar symptoms of other joints of her hands or other extremities.  Denies fever, chills, malaise or fatigue.  Has not taken anything for symptoms.  Patient also requesting a refill of her levocetirizine for allergy symptoms.  Notes symptoms were well-controlled on this regimen.  Has not been able to establish with a primary care provider yet   HPI: HPI  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   Allergic rhinitis 03/17/2020   Encounter to establish care 11/02/2019   Obesity (BMI 30-39.9) 11/02/2019   Nail fungus 11/02/2019    Allergies: No Known Allergies Medications:  Current Outpatient Medications:    diclofenac Sodium (VOLTAREN) 1 % GEL, Apply 2 g topically 4 (four) times daily., Disp: 2 g, Rfl: 0   levocetirizine (XYZAL) 5  MG tablet, Take 1 tablet (5 mg total) by mouth every evening., Disp: 30 tablet, Rfl: 1  Observations/Objective: Patient is well-developed, well-nourished in no acute distress.  Resting comfortably in parked car.  Head is normocephalic, atraumatic.  No labored breathing. Speech is clear and coherent with logical content.  Patient is alert and oriented at baseline.  Normal range of motion of third finger of right hand.   Questionable mild swelling proximal to the PIP.  No appreciable erythema or other skin change  Assessment and Plan: 1. Non-seasonal allergic rhinitis due to pollen - levocetirizine (XYZAL) 5 MG tablet; Take 1 tablet (5 mg total) by mouth every evening.  Dispense: 30 tablet; Refill: 1  Xyzal refilled.  Supportive measures reviewed.  2. Pain in finger joint on movement - diclofenac Sodium (VOLTAREN) 1 % GEL; Apply 2 g topically 4 (four) times daily.  Dispense: 2 g; Refill: 0  Suspect overuse injury as she notes she spends a lot of time on her phone "swiping" with this finger.  She is to avoid that for now.  Elevate hand while resting.  Cold compresses discussed.  Can take Tylenol OTC.  Will prescribe topical Voltaren gel to apply to the area.  Will need in person evaluation if symptoms not resolving or if anything worsens despite treatment.  Follow Up Instructions: I discussed the assessment and treatment plan with the patient. The patient was provided an opportunity to ask questions and all were answered. The patient agreed with the plan and demonstrated an understanding of the instructions.  A copy of instructions were sent to the patient via MyChart unless otherwise noted below.   The patient was advised to call back or seek an in-person evaluation if the symptoms worsen or if the condition fails to improve as anticipated.  Time:  I spent 12 minutes with the patient via telehealth technology discussing the above problems/concerns.    [name], PA-C

## 2022-09-01 NOTE — Patient Instructions (Signed)
  [name], thank you for joining [name], PA-C for today's virtual visit.  While this provider is not your primary care provider (PCP), if your PCP is located in our provider database this encounter information will be shared with them immediately following your visit.   A [facility] MyChart account gives you access to today's visit and all your visits, tests, and labs performed at [facility] " click here if you don't have a [facility] MyChart account or go to [email or web address]  Consent: (Patient) Mary Conner provided verbal consent for this virtual visit at the beginning of the encounter.  Current Medications:  Current Outpatient Medications:    diclofenac Sodium (VOLTAREN) 1 % GEL, Apply 2 g topically 4 (four) times daily., Disp: 2 g, Rfl: 0   levocetirizine (XYZAL) 5 MG tablet, Take 1 tablet (5 mg total) by mouth every evening., Disp: 30 tablet, Rfl: 1   Medications ordered in this encounter:  Meds ordered this encounter  Medications   levocetirizine (XYZAL) 5 MG tablet    Sig: Take 1 tablet (5 mg total) by mouth every evening.    Dispense:  30 tablet    Refill:  1    Order Specific Question:   Supervising Provider    Answer:   Merrilee Jansky [id number]   diclofenac Sodium (VOLTAREN) 1 % GEL    Sig: Apply 2 g topically 4 (four) times daily.    Dispense:  2 g    Refill:  0    Order Specific Question:   Supervising Provider    Answer:   Merrilee Jansky [[id number]]     *If you need refills on other medications prior to your next appointment, please contact your pharmacy*  Follow-Up: Call back or seek an in-person evaluation if the symptoms worsen or if the condition fails to improve as anticipated.  [facility] [phone]  Other Instructions I have sent in a refill of your allergy medicine.  Take as directed.  Use the link below to help get established with a new primary care provider.  For the finger,  elevate and ice.  Tylenol OTC for pain.  Use the topical Voltaren gel as directed.  Avoid overuse of that finger, especially with using your smart phone.  If symptoms or not improving over the rest of the week and then resolving, or if you note any new or worsening symptoms despite treatment, please seek an in person evaluation ASAP.  Please do not delay care.   If you have been instructed to have an in-person evaluation today at a local Urgent Care facility, please use the link below. It will take you to a list of all of our available [facility], including address, phone number and hours of operation. Please do not delay care.  [facility]  If you or a family member do not have a primary care provider, use the link below to schedule a visit and establish care. When you choose a [facility] primary care physician or advanced practice provider, you gain a long-term partner in health. Find a Primary Care Provider  Learn more about [facility]'s in-office and virtual care options: [facility] - Get Care Now

## 2022-10-05 ENCOUNTER — Other Ambulatory Visit: Payer: Self-pay

## 2022-10-05 MED ORDER — LEVOCETIRIZINE DIHYDROCHLORIDE 5 MG PO TABS
5.0000 mg | ORAL_TABLET | Freq: Every evening | ORAL | 0 refills | Status: DC
  Filled 2022-10-05: qty 30, 30d supply, fill #0

## 2022-11-11 ENCOUNTER — Other Ambulatory Visit: Payer: Self-pay

## 2022-11-11 ENCOUNTER — Telehealth: Payer: 59 | Admitting: Family Medicine

## 2022-11-11 DIAGNOSIS — R21 Rash and other nonspecific skin eruption: Secondary | ICD-10-CM

## 2022-11-11 MED ORDER — NYSTATIN-TRIAMCINOLONE 100000-0.1 UNIT/GM-% EX OINT
1.0000 | TOPICAL_OINTMENT | Freq: Two times a day (BID) | CUTANEOUS | 0 refills | Status: DC
  Filled 2022-11-11: qty 30, 15d supply, fill #0

## 2022-11-11 NOTE — Progress Notes (Signed)
E Visit for Rash  We are sorry that you are not feeling well. Here is how we plan to help!   Based upon your presentation it appears you have a fungal infection.  I have prescribed: mycolog ointment for this   HOME CARE:  Take cool showers and avoid direct sunlight. Apply cool compress or wet dressings. Take a bath in an oatmeal bath.  Sprinkle content of one Aveeno packet under running faucet with comfortably warm water.  Bathe for 15-20 minutes, 1-2 times daily.  Pat dry with a towel. Do not rub the rash. Use hydrocortisone cream. Take an antihistamine like Benadryl for widespread rashes that itch.  The adult dose of Benadryl is 25-50 mg by mouth 4 times daily. Caution:  This type of medication may cause sleepiness.  Do not drink alcohol, drive, or operate dangerous machinery while taking antihistamines.  Do not take these medications if you have prostate enlargement.  Read package instructions thoroughly on all medications that you take.  GET HELP RIGHT AWAY IF:  Symptoms don't go away after treatment. Severe itching that persists. If you rash spreads or swells. If you rash begins to smell. If it blisters and opens or develops a yellow-brown crust. You develop a fever. You have a sore throat. You become short of breath.  MAKE SURE YOU:  Understand these instructions. Will watch your condition. Will get help right away if you are not doing well or get worse.  Thank you for choosing an e-visit.  Your e-visit answers were reviewed by a board certified advanced clinical practitioner to complete your personal care plan. Depending upon the condition, your plan could have included both over the counter or prescription medications.  Please review your pharmacy choice. Make sure the pharmacy is open so you can pick up prescription now. If there is a problem, you may contact your provider through [employer] and have the prescription routed to another pharmacy.  Your safety is  important to [place]. If you have drug allergies check your prescription carefully.   For the next 24 hours you can use MyChart to ask questions about today's visit, request a non-urgent call back, or ask for a work or school excuse. You will get an email in the next two days asking about your experience. I hope that your e-visit has been valuable and will speed your recovery.  I provided 5 minutes of non face-to-face time during this encounter for chart review, medication and order placement, as well as and documentation.

## 2022-11-12 ENCOUNTER — Other Ambulatory Visit: Payer: Self-pay

## 2022-12-30 ENCOUNTER — Institutional Professional Consult (permissible substitution): Payer: 59 | Admitting: Plastic Surgery

## 2023-01-25 ENCOUNTER — Telehealth: Payer: 59 | Admitting: Nurse Practitioner

## 2023-01-25 DIAGNOSIS — M545 Low back pain, unspecified: Secondary | ICD-10-CM

## 2023-01-26 ENCOUNTER — Other Ambulatory Visit: Payer: Self-pay

## 2023-01-26 MED ORDER — NAPROXEN 500 MG PO TABS
500.0000 mg | ORAL_TABLET | Freq: Two times a day (BID) | ORAL | 0 refills | Status: DC
Start: 2023-01-26 — End: 2023-08-05
  Filled 2023-01-26: qty 30, 15d supply, fill #0

## 2023-01-26 MED ORDER — CYCLOBENZAPRINE HCL 10 MG PO TABS
10.0000 mg | ORAL_TABLET | Freq: Three times a day (TID) | ORAL | 0 refills | Status: DC | PRN
Start: 2023-01-26 — End: 2023-08-05
  Filled 2023-01-26: qty 30, 10d supply, fill #0

## 2023-01-26 NOTE — Progress Notes (Signed)
We are sorry that you are not feeling well.  Here is how we plan to help!  Based on what you have shared with me it looks like you mostly have acute back pain.  Acute back pain is defined as musculoskeletal pain that can resolve in 1-3 weeks with conservative treatment.  I have prescribed Naprosyn 500 mg take one by mouth twice a day non-steroid anti-inflammatory (NSAID) as well as Flexeril 10 mg every eight hours as needed which is a muscle relaxer  Some patients experience stomach irritation or in increased heartburn with anti-inflammatory drugs.  Please keep in mind that muscle relaxer's can cause fatigue and should not be taken while at work or driving.  Back pain is very common.  The pain often gets better over time.  The cause of back pain is usually not dangerous.  Most people can learn to manage their back pain on their own.  Home Care Stay active.  Start with short walks on flat ground if you can.  Try to walk farther each day. Do not sit, drive or stand in one place for more than 30 minutes.  Do not stay in bed. Do not avoid exercise or work.  Activity can help your back heal faster. Be careful when you bend or lift an object.  Bend at your knees, keep the object close to you, and do not twist. Sleep on a firm mattress.  Lie on your side, and bend your knees.  If you lie on your back, put a pillow under your knees. Only take medicines as told by your doctor. Put ice on the injured area. Put ice in a plastic bag Place a towel between your skin and the bag Leave the ice on for 15-20 minutes, 3-4 times a day for the first 2-3 days. 210 After that, you can switch between ice and heat packs. Ask your doctor about back exercises or massage. Avoid feeling anxious or stressed.  Find good ways to deal with stress, such as exercise.  Get Help Right Way If: Your pain does not go away with rest or medicine. Your pain does not go away in 1 week. You have new problems. You do not feel  well. The pain spreads into your legs. You cannot control when you poop (bowel movement) or pee (urinate) You feel sick to your stomach (nauseous) or throw up (vomit) You have belly (abdominal) pain. You feel like you may pass out (faint). If you develop a fever.  Make Sure you: Understand these instructions. Will watch your condition Will get help right away if you are not doing well or get worse.  Your e-visit answers were reviewed by a board certified advanced clinical practitioner to complete your personal care plan.  Depending on the condition, your plan could have included both over the counter or prescription medications.  If there is a problem please reply  once you have received a response from your provider.  Your safety is important to us.  If you have drug allergies check your prescription carefully.    You can use MyChart to ask questions about today's visit, request a non-urgent call back, or ask for a work or school excuse for 24 hours related to this e-Visit. If it has been greater than 24 hours you will need to follow up with your provider, or enter a new e-Visit to address those concerns.  You will get an e-mail in the next two days asking about your experience.  I hope that   your e-visit has been valuable and will speed your recovery. Thank you for using e-visits.   Meds ordered this encounter  Medications   naproxen (NAPROSYN) 500 MG tablet    Sig: Take 1 tablet (500 mg total) by mouth 2 (two) times daily with a meal.    Dispense:  30 tablet    Refill:  0   cyclobenzaprine (FLEXERIL) 10 MG tablet    Sig: Take 1 tablet (10 mg total) by mouth 3 (three) times daily as needed for muscle spasms.    Dispense:  30 tablet    Refill:  0     I spent approximately 5 minutes reviewing the patient's history, current symptoms and coordinating their care today.   

## 2023-01-27 ENCOUNTER — Other Ambulatory Visit: Payer: Self-pay

## 2023-01-28 ENCOUNTER — Other Ambulatory Visit: Payer: Self-pay

## 2023-04-15 ENCOUNTER — Telehealth: Payer: 59 | Admitting: Family Medicine

## 2023-04-15 ENCOUNTER — Encounter: Payer: 59 | Admitting: Emergency Medicine

## 2023-04-15 DIAGNOSIS — U071 COVID-19: Secondary | ICD-10-CM

## 2023-04-15 NOTE — Progress Notes (Signed)
Virtual Visit Consent   Mary Conner, you are scheduled for a virtual visit with a [facility] provider today. Just as with appointments in the office, your consent must be obtained to participate. Your consent will be active for this visit and any virtual visit you may have with one of our providers in the next 365 days. If you have a MyChart account, a copy of this consent can be sent to you electronically.  As this is a virtual visit, video technology does not allow for your provider to perform a traditional examination. This may limit your provider's ability to fully assess your condition. If your provider identifies any concerns that need to be evaluated in person or the need to arrange testing (such as labs, EKG, etc.), we will make arrangements to do so. Although advances in technology are sophisticated, we cannot ensure that it will always work on either your end or our end. If the connection with a video visit is poor, the visit may have to be switched to a telephone visit. With either a video or telephone visit, we are not always able to ensure that we have a secure connection.  By engaging in this virtual visit, you consent to the provision of healthcare and authorize for your insurance to be billed (if applicable) for the services provided during this visit. Depending on your insurance coverage, you may receive a charge related to this service.  I need to obtain your verbal consent now. Are you willing to proceed with your visit today? Mary Conner has provided verbal consent on 04/15/2023 for a virtual visit (video or telephone). Georgana Curio, FNP  Date: 04/15/2023 9:46 AM  Virtual Visit via Video Note   I, Georgana Curio, connected with  Mary Conner  ([id number], May 25, 1981) on 04/15/23 at  9:45 AM EDT by a video-enabled telemedicine application and verified that I am speaking with the correct person using two identifiers.  Location: Patient: Virtual Visit Location  Patient: Home Provider: Virtual Visit Location Provider: Home Office   I discussed the limitations of evaluation and management by telemedicine and the availability of in person appointments. The patient expressed understanding and agreed to proceed.    History of Present Illness: Mary Conner is a 42 y.o. who identifies as a female who was assigned female at birth, and is being seen today for covid positive testing with sx starting 2 days ago. Head congestion, cough, headache, chills, with sx persisting. She is taking dayquil and nyquil with relief. She needs a work note. Marland Kitchen  HPI: HPI  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   Allergic rhinitis 03/17/2020   Encounter to establish care 11/02/2019   Obesity (BMI 30-39.9) 11/02/2019   Nail fungus 11/02/2019    Allergies: No Known Allergies Medications:  Current Outpatient Medications:    cyclobenzaprine (FLEXERIL) 10 MG tablet, Take 1 tablet (10 mg total) by mouth 3 (three) times daily as needed for muscle spasms., Disp: 30 tablet, Rfl: 0   diclofenac Sodium (VOLTAREN) 1 % GEL, Apply 2 g topically 4 (four) times daily., Disp: 2 g, Rfl: 0   levocetirizine (XYZAL) 5 MG tablet, Take 1 tablet (5 mg total) by mouth every evening., Disp: 30 tablet, Rfl: 1   levocetirizine (XYZAL) 5 MG tablet, Take 1 tablet (5 mg total) by mouth every evening., Disp: 30 tablet, Rfl: 0   naproxen (NAPROSYN) 500 MG tablet, Take 1 tablet (  500 mg total) by mouth 2 (two) times daily with a meal., Disp: 30 tablet, Rfl: 0   nystatin-triamcinolone ointment (MYCOLOG), Apply 1 Application topically 2 (two) times daily., Disp: 30 g, Rfl: 0  Observations/Objective: Patient is well-developed, well-nourished in no acute distress.  Resting comfortably  at home.  Head is normocephalic, atraumatic.  No labored breathing.  Speech is clear and coherent with logical content.  Patient is alert and  oriented at baseline.    Assessment and Plan: 1. COVID  Increase fluids, humidifier, tylenol or ibuprofen as directed, UC if sx worsen, Vit D and zinc, quarantine discussed.   Follow Up Instructions: I discussed the assessment and treatment plan with the patient. The patient was provided an opportunity to ask questions and all were answered. The patient agreed with the plan and demonstrated an understanding of the instructions.  A copy of instructions were sent to the patient via MyChart unless otherwise noted below.     The patient was advised to call back or seek an in-person evaluation if the symptoms worsen or if the condition fails to improve as anticipated.  Time:  I spent 10 minutes with the patient via telehealth technology discussing the above problems/concerns.    Georgana Curio, FNP

## 2023-04-15 NOTE — Patient Instructions (Signed)

## 2023-05-05 IMAGING — DX DG FOOT COMPLETE 3+V*R*
3 series · 3 of 3 positions shown · non-contrast
Comparison: None.

CLINICAL DATA: Injury.

EXAM:
RIGHT FOOT COMPLETE - 3+ VIEW

[foot ap]
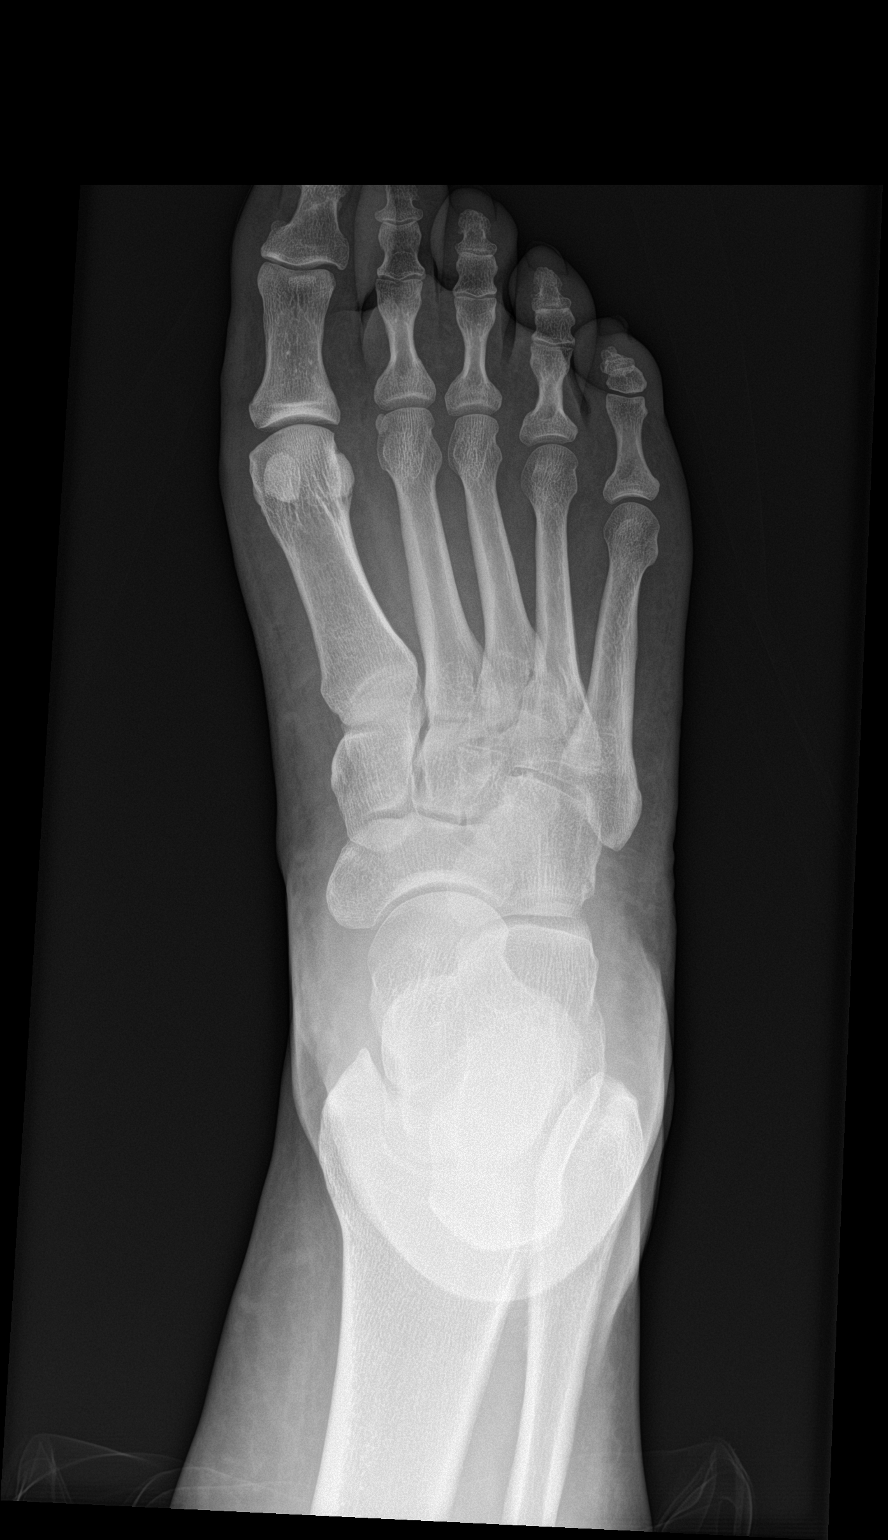

[foot obl]
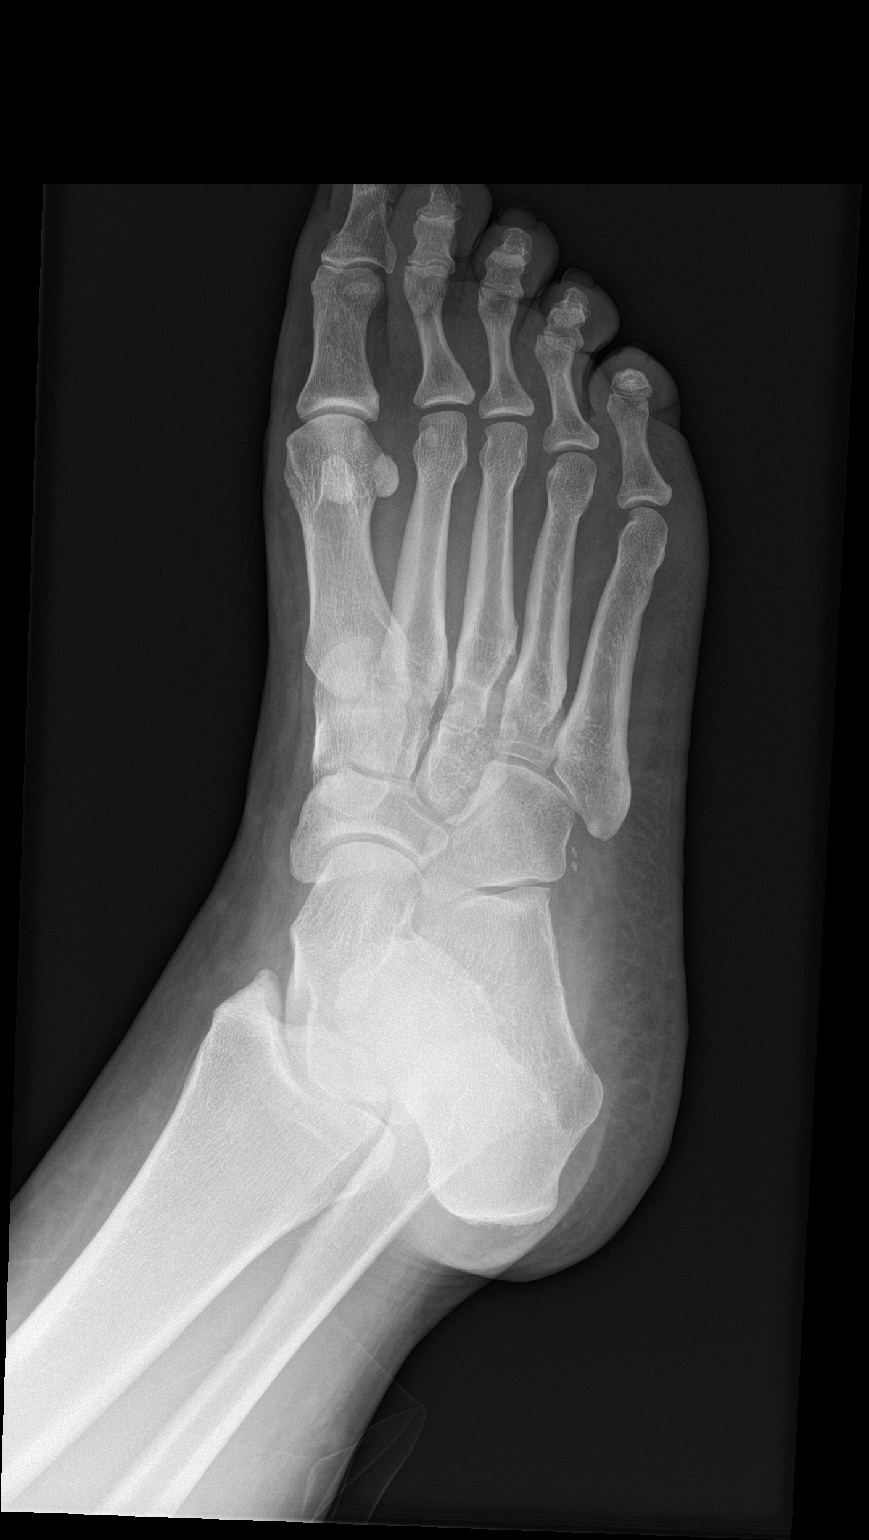

[foot lat]
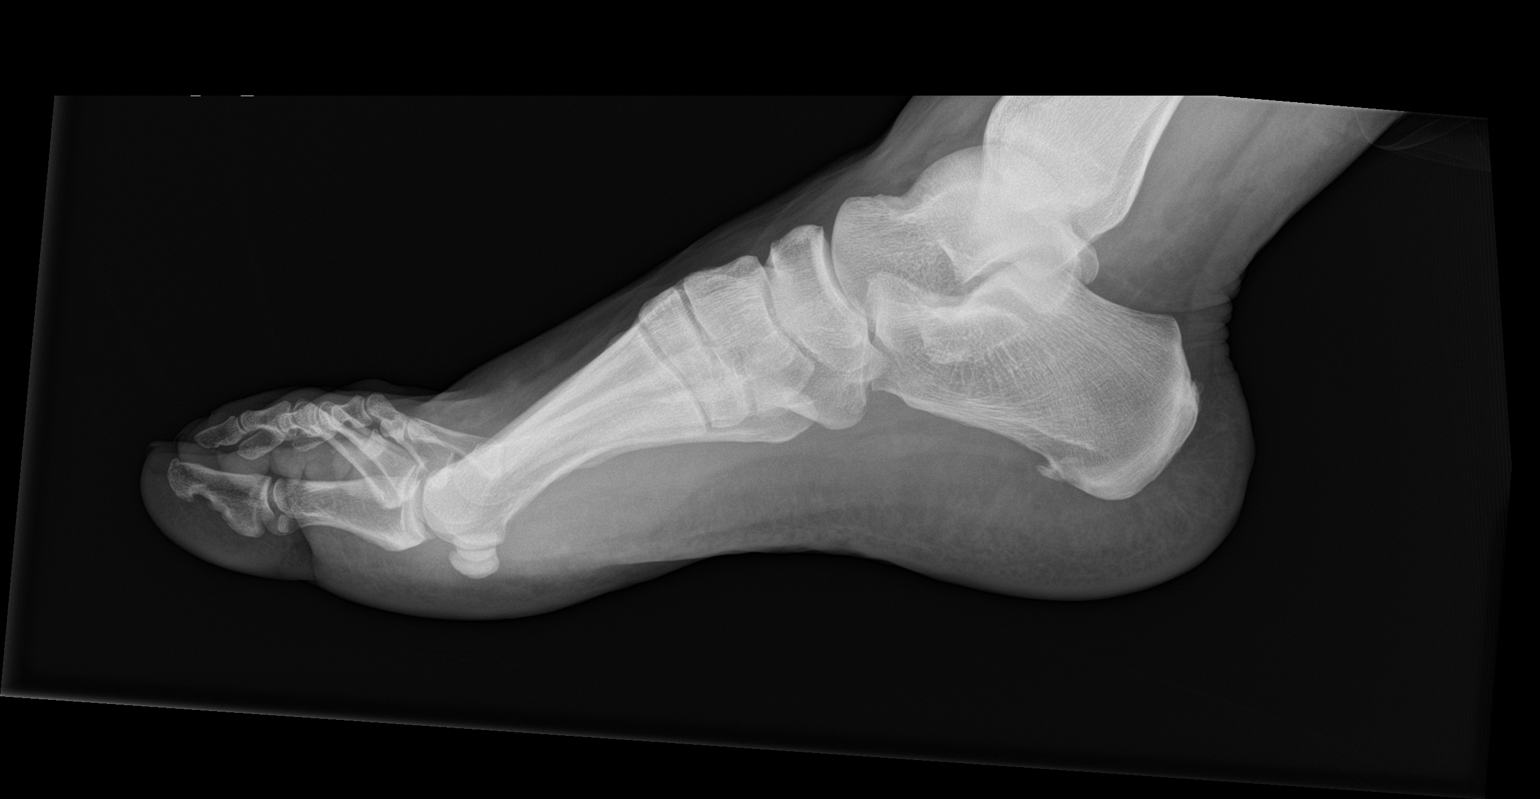

[3 of 3 positions shown; findings below may reference images not displayed]

FINDINGS: There is no evidence of fracture or dislocation. There is no
evidence of arthropathy or other focal bone abnormality. Small
plantar calcaneal spur. Soft tissues are unremarkable.
IMPRESSION: No fracture or dislocation of the right foot.

## 2023-05-20 NOTE — Telephone Encounter (Signed)
This encounter was created in error - please disregard.

## 2023-05-20 NOTE — Progress Notes (Signed)
Patient trying to do video visit.  E-visit cancelled.  Disregard.

## 2023-08-05 ENCOUNTER — Ambulatory Visit (INDEPENDENT_AMBULATORY_CARE_PROVIDER_SITE_OTHER): Payer: 59 | Admitting: Pediatrics

## 2023-08-05 ENCOUNTER — Other Ambulatory Visit (HOSPITAL_COMMUNITY): Payer: Self-pay

## 2023-08-05 ENCOUNTER — Encounter: Payer: Self-pay | Admitting: Pediatrics

## 2023-08-05 ENCOUNTER — Other Ambulatory Visit: Payer: Self-pay

## 2023-08-05 VITALS — BP 120/85 | HR 76 | Temp 98.4°F | Ht 64.8 in | Wt 228.4 lb

## 2023-08-05 DIAGNOSIS — Z1330 Encounter for screening examination for mental health and behavioral disorders, unspecified: Secondary | ICD-10-CM

## 2023-08-05 DIAGNOSIS — H1045 Other chronic allergic conjunctivitis: Secondary | ICD-10-CM

## 2023-08-05 DIAGNOSIS — R6889 Other general symptoms and signs: Secondary | ICD-10-CM | POA: Diagnosis not present

## 2023-08-05 DIAGNOSIS — Z1231 Encounter for screening mammogram for malignant neoplasm of breast: Secondary | ICD-10-CM

## 2023-08-05 DIAGNOSIS — J301 Allergic rhinitis due to pollen: Secondary | ICD-10-CM

## 2023-08-05 DIAGNOSIS — Z6838 Body mass index (BMI) 38.0-38.9, adult: Secondary | ICD-10-CM

## 2023-08-05 DIAGNOSIS — Z7689 Persons encountering health services in other specified circumstances: Secondary | ICD-10-CM

## 2023-08-05 HISTORY — DX: Other chronic allergic conjunctivitis: H10.45

## 2023-08-05 HISTORY — DX: Allergic rhinitis due to pollen: J30.1

## 2023-08-05 MED ORDER — CONTRAVE 8-90 MG PO TB12
ORAL_TABLET | ORAL | 1 refills | Status: DC
Start: 2023-08-05 — End: 2023-09-06
  Filled 2023-08-05: qty 120, 47d supply, fill #0
  Filled 2023-08-05 – 2023-08-10 (×4): qty 120, 28d supply, fill #0
  Filled 2023-08-15: qty 120, 47d supply, fill #0

## 2023-08-05 NOTE — Progress Notes (Unsigned)
Establish Care Note  BP 120/85   Pulse 76   Temp 98.4 F (36.9 C) (Oral)   Ht 5' 4.8" (1.646 m)   Wt 228 lb 6.4 oz (103.6 kg)   LMP 07/31/2023 (Exact Date)   SpO2 98%   BMI 38.24 kg/m    Subjective:    Patient ID: Mary Conner, female    DOB: 11-27-1980, 42 y.o.   MRN: [id number]  HPI: Mary Conner is a 42 y.o. female  Chief Complaint  Patient presents with   Obesity    Patient states she would like to discuss something for her weight. States she has been on phentermine and a shot for her weight in the past. States she would loose weight but then gain it back.     Establishing care, the following was discussed today:  Discussed the use of AI scribe software for clinical note transcription with the patient, who gave verbal consent to proceed.  History of Present Illness   The patient, previously under the care of Dr. Sampson Goon, presents with a primary concern of weight management. She reports a long-standing struggle with weight loss, despite consistent exercise including treadmill, spin, and weight training. The patient has previously tried various weight loss pills and appetite suppressants, including phentermine, but found these to be ineffective in the long term. She describes a pattern of overeating, particularly after exercise, and expresses a desire to feel more comfortable in her body. Her goal weight is around 200 pounds, down from her current weight of 228 pounds.  The patient also mentions a recent history of COVID-19 infection approximately three months ago. She has not yet received a COVID-19 vaccination and expresses some hesitancy about doing so.  In terms of preventative care, the patient acknowledges she is not up-to-date with Pap smears and breast cancer screenings. She expresses some concern about cancer, given recent experiences of cancer in her family and social circle, including her mother's recent death from pancreatic cancer.  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 She also expresses a preference for wine, which she consumes occasionally.     #HM Immunizations: covid next visit (recent recovered). Overdue for tdap. Breast cancer screen: overdue, will order today Cervical cancer screen: overdue, will do at physical    Relevant past medical, surgical, family and social history reviewed and updated as indicated. Interim medical history since our last visit reviewed. Allergies and medications reviewed and updated.  ROS per HPI unless specifically indicated above     Objective:    BP 120/85   Pulse 76   Temp 98.4 F (36.9 C) (Oral)   Ht 5' 4.8" (1.646 m)   Wt 228 lb 6.4 oz (103.6 kg)   LMP 07/31/2023 (Exact Date)   SpO2 98%   BMI 38.24 kg/m   Wt Readings from Last 3 Encounters:  08/05/23 228 lb 6.4 oz (103.6 kg)  02/08/22 230 lb (104.3 kg)  01/11/22 235 lb (106.6 kg)     Physical Exam Constitutional:      Appearance: Normal appearance.  HENT:     Head: Normocephalic and atraumatic.  Eyes:     Pupils: Pupils are equal, round, and reactive to light.  Cardiovascular:     Rate and Rhythm: Normal rate and regular rhythm.     Pulses: Normal pulses.     Heart sounds: Normal heart sounds.  Pulmonary:     Effort: Pulmonary effort is normal.  Breath sounds: Normal breath sounds.  Abdominal:     General: Abdomen is flat.     Palpations: Abdomen is soft.  Musculoskeletal:        General: Normal range of motion.     Cervical back: Normal range of motion.  Skin:    General: Skin is warm and dry.     Capillary Refill: Capillary refill takes less than 2 seconds.  Neurological:     General: No focal deficit present.     Mental Status: She is alert. Mental status is at baseline.  Psychiatric:        Mood and Affect: Mood normal.        Behavior: Behavior normal.         08/05/2023    2:01 PM 03/17/2020   11:54 AM 11/02/2019    9:16 AM  Depression screen PHQ 2/9   Decreased Interest 0 0 0  Down, Depressed, Hopeless 0 0 0  PHQ - 2 Score 0 0 0  Altered sleeping 0    Tired, decreased energy 0    Change in appetite 1    Feeling bad or failure about yourself  0    Trouble concentrating 0    Moving slowly or fidgety/restless 0    Suicidal thoughts 0    PHQ-9 Score 1    Difficult doing work/chores Not difficult at all          08/05/2023    2:01 PM  GAD 7 : Generalized Anxiety Score  Nervous, Anxious, on Edge 0  Control/stop worrying 0  Worry too much - different things 0  Trouble relaxing 0  Restless 0  Easily annoyed or irritable 0  Afraid - awful might happen 0  Total GAD 7 Score 0  Anxiety Difficulty Not difficult at all       Assessment & Plan:  Assessment & Plan   Encounter to establish care Reviewed patient record including history, medications, problem list. HM updated as able. Will bring records and will fill HM gaps as needed at follow up visit.  Difficulty maintaining weight loss Encounter for weight management BMI 38.0-38.9,adult Assessment & Plan: Struggles with weight loss despite regular exercise. Previous use of phentermine with limited success. Experiences increased hunger post-exercise. Goal weight is 200 lbs, current weight is 228 lbs. -Start Contrave (naltrexone/bupropion) as per packaging instructions. -Encouraged Mediterranean diet and provided educational materials. -Plan to reassess in 1 month.  Orders: -     Contrave; Take 1 tablet by mouth every morning for 7 days, THEN 1 tablet 2 (two) times daily for 7 days, THEN 3 tablets (2 tablets in the morning and 1 tablet in the evening) daily  Dispense: 120 tablet; Refill: 1  Encounter for behavioral health screening As part of their intake evaluation, the patient was screened for depression, anxiety.  PHQ9 SCORE 1, GAD7 SCORE 0. Screening results negative for tested conditions. See plan under problem/diagnosis above.  Encounter for screening mammogram for  malignant neoplasm of breast -     3D Screening Mammogram, Left and Right; Future   Follow up plan: Return for Physical.  Dwaine Pringle Howell Pringle, MD

## 2023-08-05 NOTE — Patient Instructions (Signed)
Good to meet you! Welcome to [facility]!  As your primary care doctor, I look forward to working with you to help you reach your health goals.  Please be aware of a couple of logistical items: - If you message me on mychart, it may take me 1-2 business days to get back to you. This is for non-urgent messaging.  - If you require urgent clinical attention, please call the clinic or present to urgent care/emergency room - If you have labs, I typically will send a message about them in 1-2 business days. - I am not here on Mondays, otherwise will be available from Tuesday-Friday during 8a-5pm.

## 2023-08-08 ENCOUNTER — Other Ambulatory Visit: Payer: Self-pay

## 2023-08-09 ENCOUNTER — Encounter: Payer: Self-pay | Admitting: Pediatrics

## 2023-08-09 NOTE — Assessment & Plan Note (Signed)
Struggles with weight loss despite regular exercise. Previous use of phentermine with limited success. Experiences increased hunger post-exercise. Goal weight is 200 lbs, current weight is 228 lbs. -Start Contrave (naltrexone/bupropion) as per packaging instructions. -Encouraged Mediterranean diet and provided educational materials. -Plan to reassess in 1 month.

## 2023-08-10 ENCOUNTER — Other Ambulatory Visit: Payer: Self-pay

## 2023-08-11 ENCOUNTER — Other Ambulatory Visit: Payer: Self-pay

## 2023-08-15 ENCOUNTER — Other Ambulatory Visit: Payer: Self-pay

## 2023-09-06 ENCOUNTER — Other Ambulatory Visit (HOSPITAL_COMMUNITY)
Admission: RE | Admit: 2023-09-06 | Discharge: 2023-09-06 | Disposition: A | Discharge status: Home / Self Care | Payer: 59 | Source: Ambulatory Visit | Attending: Pediatrics | Admitting: Pediatrics

## 2023-09-06 ENCOUNTER — Other Ambulatory Visit: Payer: Self-pay

## 2023-09-06 ENCOUNTER — Ambulatory Visit (INDEPENDENT_AMBULATORY_CARE_PROVIDER_SITE_OTHER): Payer: 59 | Admitting: Pediatrics

## 2023-09-06 VITALS — BP 133/83 | HR 73 | Temp 98.3°F | Resp 16 | Ht 64.8 in | Wt 228.8 lb

## 2023-09-06 DIAGNOSIS — Z0000 Encounter for general adult medical examination without abnormal findings: Secondary | ICD-10-CM

## 2023-09-06 DIAGNOSIS — N393 Stress incontinence (female) (male): Secondary | ICD-10-CM | POA: Insufficient documentation

## 2023-09-06 DIAGNOSIS — Z13220 Encounter for screening for lipoid disorders: Secondary | ICD-10-CM | POA: Diagnosis not present

## 2023-09-06 DIAGNOSIS — Z124 Encounter for screening for malignant neoplasm of cervix: Secondary | ICD-10-CM

## 2023-09-06 DIAGNOSIS — Z6838 Body mass index (BMI) 38.0-38.9, adult: Secondary | ICD-10-CM | POA: Diagnosis not present

## 2023-09-06 DIAGNOSIS — Z7689 Persons encountering health services in other specified circumstances: Secondary | ICD-10-CM

## 2023-09-06 DIAGNOSIS — Z1330 Encounter for screening examination for mental health and behavioral disorders, unspecified: Secondary | ICD-10-CM

## 2023-09-06 DIAGNOSIS — Z131 Encounter for screening for diabetes mellitus: Secondary | ICD-10-CM

## 2023-09-06 MED ORDER — SEMAGLUTIDE-WEIGHT MANAGEMENT 0.25 MG/0.5ML ~~LOC~~ SOAJ
0.2500 mg | SUBCUTANEOUS | 0 refills | Status: AC
Start: 2023-09-06 — End: 2023-10-04
  Filled 2023-09-06 – 2023-09-28 (×2): qty 2, 28d supply, fill #0

## 2023-09-06 MED ORDER — SEMAGLUTIDE-WEIGHT MANAGEMENT 0.5 MG/0.5ML ~~LOC~~ SOAJ
0.5000 mg | SUBCUTANEOUS | 0 refills | Status: AC
  Filled 2023-09-06: qty 2, 28d supply, fill #0

## 2023-09-06 NOTE — Patient Instructions (Addendum)
If approved, please stop the contrave once you start wegovy:  The schedule for Reginal Lutes is: 0.25mg  for 4 weeks 0.5mg  for 4 weeks RETURN VISIT IN CLINIC TO CHECK IN 1.7mg  for 4 weeks 2.4 mg goal dose  The most common adverse reactions, reported in >=5% of patients treated with [facility] are nausea, vomiting, diarrhea, abdominal pain, and constipation. These typically resolve over time but please let me know if they do not resolve.  Get savings information If you have questions about cost, coverage, or savings, call the [facility] Navigation Line at [phone], Monday through Friday, 9:00 AM to 6:00 PM ET.  For instructional videos and more information about wegovy go here: [email or web address]   Great to see you today! Please take a moment to visit this website to review the different types of birth control available to you: [email or web address]  Below is a summary table:  Method How well does it work? How to Use Pros Cons  The Implant Nexplanon   > 99% A health care provider places it under the skin of the upper arm It must be removed by a health care provider Long lasting (up to 3 years) No pill to take daily Often decreases cramps Can be used while breastfeeding You can become pregnant right after it is removed Can cause irregular bleeding After 1 year, you may have no period at all Does not protect against human immunodeficiency virus (HIV) or other sexually transmitted infections (STIs)  Progestin IUD Mirena, Skyla   > 99% Must be placed in uterus by a health care provider Usually removed by a health care provider Mirena may be left in place up to 7 years. Christean Grief may be left in place up to 3 years No pill to take daily May improve period cramps and bleeding Can be used while breastfeeding You can become pregnant right after it is removed May cause lighter periods, spotting, or no period at all Rarely,  uterus is injured during placement Does not protect against HIV or other STIs   Copper IUD ParaGard   > 99% Must be placed in uterus by a health care provider Usually removed by a health care provider May be left in place for up to 12 years No pill to take daily Can be used while breastfeeding You can become pregnant right after it is removed May cause more cramps and heavier periods May cause spotting between periods Rarely, uterus is injured during placement Does not protect against HIV or other STIs  The Shot Depo-Provera   97-99% Get a shot every 3 months Each shot works for 12 weeks Private Usually decreases periods Helps prevent cancer of the uterus No pill to take daily Can be used while breastfeeding May cause spotting, no period, weight gain, depression, hair or skin changes, change in sex drive May cause delay in getting pregnant after you stop the shots Side effects may last up to 6 months after you stop the shots Does not protect against HIV or other STIs  The Pill   92-99% Must take the pill daily Can make periods more regular and less painful Can improve PMS symptoms Can improve acne Helps prevent cancer of the ovaries You can become pregnant right after stopping the pills May cause nausea, weight gain, headaches, change in sex drive - some of these can be relieved by changing to a new brand May cause spotting the first 1-2 months Does not protect against HIV or other STIs  Progestin-Only Pills   92-99% Must take the  pill daily Can be used while breastfeeding You can become pregnant right after stopping the pills Often causes spotting, which may last for many months May cause depression, hair or skin changes, change in sex drive Does not protect against HIV or other STIs  The Patch Ortho Evra  92-99% Apply a new patch once a week for three weeks No patch in week 4 Can make periods more regular and less painful No pill to take daily You can become  pregnant right after stopping patch Can irritate skin under the patch May cause spotting the first 1-2 months Does not protect against HIV or other STIs  The Ring Nuvaring   92-99% Insert a small ring into the vagina Change ring each month One size fits all Private Does not require spermicide Can make periods more regular and less painful No pill to take daily You can become pregnant right after stopping the ring Can increase vaginal discharge May cause spotting the first 1-2 months of use Does not protect against HIV or other STIs  Female Condom   85-98% Use a new condom each time you have sex Use a polyurethane condom if allergic to latex  Can buy at many stores Can put on as part of sex play/foreplay Can help prevent early ejaculation Can be used for oral, vaginal, and anal sex Protects against HIV and other STIs Can be used while breastfeeding Can decrease sensation Can cause loss of erection Can break or slip off  Female/ Internal Condom   79-95% Use a new condom each time you have sex Use extra lubrication as needed  Can buy at many stores Can put in as part of sex play/foreplay Can be used for anal and vaginal sex May increase pleasure when used for vaginal sex Good for people with latex allergy Protects against HIV and other STIs Can be used while breastfeeding Can decrease sensation May be noisy May be hard to insert May slip out of place during sex  Withdrawal Pull-out  73-96% Pull penis out of vagina before ejaculation (that is, before coming) Costs nothing Can be used while breastfeeding  Less pleasure for some Does not work if penis is not pulled out in time Does not protect against HIV or other STIs Must interrupt sex  Diaphragm  84-94% Must be used each time you have sex Must be used with spermicide A health care provider will fit you and show you how to use it Can last several years Costs very little to use May protect against some infections,  but not HIV Can be used while breastfeeding Using spermicide may raise the risk of getting HIV Should not be used with vaginal bleeding or infection Raises risk of bladder infection  Rhythm Natural Family Planning, Fertility Awareness   75-88% Predict fertile days by: taking temperature daily, checking vaginal mucus for changes, and/or keeping a record of your periods It works best if you use more than one of these Avoid sex or use condoms/spermicide during fertile days Costs little Can be used while breastfeeding Can help with avoiding or trying to become pregnant  Must use another method during fertile days Does not work well if your periods are irregular Many things to remember with this method Does not protect against HIV or STIs  Spermicide Cream, gel, sponge,  foam, inserts, film   71-85% Insert spermicide each time you have sex  Can buy at many stores Can insert as part of sex play/foreplay Comes in many forms: cream, gel,  sponge, foam, inserts, film Can be used while breastfeeding May raise the risk of getting HIV  May irritate vagina, penis Cream, gel, and foam can be messy  Emergency Contraception Pills Progestin EC (Plan B One-Step, Next ChoiceT and others) and ulipristal acetate (ella)    0-94% Ulipristal EC works better than progestin EC if you weigh more than 155 pounds (BMI > 26).  Ulipristal EC works better than progestin EC in the 3-5 days after sex Works best the sooner you take it after unprotected sex You can take EC up to 5 days after unprotected sex If pack contains 2 pills, take both together You should start a birth control method right after using EC to avoid pregnancy Can be used while breastfeeding Available at pharmacies, health centers or health care providers: call ahead to see if they have it People of any age can get some brands without a prescription  May cause stomach upset or nausea Your next period may come early or late May cause  spotting Does not protect against HIV or other STIs If you are under age 64 you may need a prescription for some brands Ulipristal requires a prescription May cost a lot

## 2023-09-06 NOTE — Progress Notes (Signed)
BP 133/83 (BP Location: Left Arm, Patient Position: Sitting, Cuff Size: Large)   Pulse 73   Temp 98.3 F (36.8 C) (Oral)   Resp 16   Ht 5' 4.8" (1.646 m)   Wt 228 lb 12.8 oz (103.8 kg)   LMP 09/03/2023 (Exact Date)   SpO2 99%   BMI 38.31 kg/m    Annual Physical Exam - Female  Subjective:   CC: Annual Exam and Weight Check (It's hard because of the holidays)   Mary Conner is a 42 y.o. female patient here for a preventative health maintenance exam. Additional topics discussed include:  #weight management Start contrave last visit Has been tolerating well and taking consistently but no appetite suppresion experienced No weight changes See trend below Wt Readings from Last 3 Encounters:  09/06/23 228 lb 12.8 oz (103.8 kg)  08/05/23 228 lb 6.4 oz (103.6 kg)  02/08/22 230 lb (104.3 kg)   Health Habits: DIET: in general, a "healthy" diet  , portion controlled, minimal carbs EXERCISE: multiple times/week on average, activities include CV classess DENTAL EXAM: Up to Date EYE EXAM: Up to Date                       Relevant Gynecologic History LMP: Patient's last menstrual period was 09/03/2023 (exact date).  Menstrual Status: premenopausal, Flow  regular, heavy,  PAP History:  No Cervical Cancer Screening results to display.  History abnormal PAP: No  Sexual activity: single partner, contraception - condoms most of the time and coitus interruptus Family history breast, ovarian cancer: No Domestic Violence Screen, feels safe at home: Yes  family history includes Pancreatic cancer in her mother.  Social History   Tobacco Use   Smoking status: Never   Smokeless tobacco: Never  Vaping Use   Vaping status: Never Used  Substance Use Topics   Alcohol use: Yes    Comment: occasionally   Drug use: No   Social History   Social History Narrative   Grew up in [place]   Works at security for [employer]   Has 3 kids, boyfriend   Mother lives with them     Social drivers questionnaire is reviewed and is positive for : none  Depression Screening:     09/06/2023   11:14 AM 08/05/2023    2:01 PM 03/17/2020   11:54 AM 11/02/2019    9:16 AM  Depression screen PHQ 2/9  Decreased Interest 0 0 0 0  Down, Depressed, Hopeless 0 0 0 0  PHQ - 2 Score 0 0 0 0  Altered sleeping 0 0    Tired, decreased energy 0 0    Change in appetite 0 1    Feeling bad or failure about yourself  0 0    Trouble concentrating 0 0    Moving slowly or fidgety/restless 0 0    Suicidal thoughts 0 0    PHQ-9 Score 0 1    Difficult doing work/chores Not difficult at all Not difficult at all         09/06/2023   11:14 AM 08/05/2023    2:01 PM  GAD 7 : Generalized Anxiety Score  Nervous, Anxious, on Edge 0 0  Control/stop worrying 0 0  Worry too much - different things 0 0  Trouble relaxing 0 0  Restless 0 0  Easily annoyed or irritable 0 0  Afraid - awful might happen 0 0  Total GAD 7 Score 0 0  Anxiety  Difficulty Not difficult at all Not difficult at all    Mental Health Plan: continue to monitor  Self Management Goals  Goals   None     Health Maintenance Colon Cancer Screening : Not applicable Mammogram : Due, order placed, not yet scheduled DXA scan : Not applicable Immunizations : up to date and documented  Review of Systems See HPI for relevant ROS.  Outpatient Medications Prior to Visit  Medication Sig Dispense Refill   Naltrexone-buPROPion HCl ER (CONTRAVE) 8-90 MG TB12 Take 1 tablet by mouth every morning for 7 days, THEN 1 tablet 2 (two) times daily for 7 days, THEN 3 tablets (2 tablets in the morning and 1 tablet in the evening) daily 120 tablet 1   No facility-administered medications prior to visit.     Patient Active Problem List   Diagnosis Date Noted   Stress incontinence 09/06/2023    Objective:   Vitals:   09/06/23 1115  BP: 133/83  Pulse: 73  Temp: 98.3 F (36.8 C)  Resp: 16  Height: 5' 4.8" (1.646 m)   Weight: 228 lb 12.8 oz (103.8 kg)  SpO2: 99%  TempSrc: Oral  BMI (Calculated): 38.31    Body mass index is 38.31 kg/m.  Physical Exam Exam conducted with a chaperone present.  Constitutional:      Appearance: Normal appearance.  HENT:     Head: Normocephalic and atraumatic.  Eyes:     Pupils: Pupils are equal, round, and reactive to light.  Cardiovascular:     Rate and Rhythm: Normal rate and regular rhythm.     Pulses: Normal pulses.     Heart sounds: Normal heart sounds.  Pulmonary:     Effort: Pulmonary effort is normal.     Breath sounds: Normal breath sounds.  Abdominal:     General: Abdomen is flat.     Palpations: Abdomen is soft.  Genitourinary:    General: Normal vulva.     Exam position: Lithotomy position.     Vagina: Normal.     Cervix: Normal.  Musculoskeletal:        General: Normal range of motion.     Cervical back: Normal range of motion.  Skin:    General: Skin is warm and dry.     Capillary Refill: Capillary refill takes less than 2 seconds.  Neurological:     General: No focal deficit present.     Mental Status: She is alert. Mental status is at baseline.  Psychiatric:        Mood and Affect: Mood normal.        Behavior: Behavior normal.     Assessment and Plan:   Annual physical exam Discussed lifestyle modifications and goals including plant based eating styles (such as: Mediterranean eating style), regular exercise (at least 150 min of moderate-intensity aerobic exercise per week, given AHA workout handout), get adequate sleep, and continue working with PCP towards meeting health goals to ensure healthy aging. Baseline labs as below. -     CBC -     Comprehensive metabolic panel  Stress incontinence Chronic issue with some exercise and/or sneezing. Interested in pelvic floor therapy. -     Ambulatory referral to Physical Therapy  Encounter for weight management BMI 38.0-38.9,adult Patient recently start contrave without any  expected changes. Has not lost any weight, feels appetite is the same. She continue dietary modifications and is very physically active. She is interested in weight loss wegovy medication. Sent first 2 months. Can  discontinue the contrave. Reviewed possible side effects and administration of medicaiton. May need to be on wegovy long term to maintain weight loss. Voiced understanding. -     TSH -     Semaglutide-Weight Management; Inject 0.25 mg into the skin once a week for 28 days.  Dispense: 2 mL; Refill: 0 -     Semaglutide-Weight Management; Inject 0.5 mg into the skin once a week for 28 days.  Dispense: 2 mL; Refill: 0  Encounter for behavioral health screening As part of their intake evaluation, the patient was screened for depression, anxiety.  PHQ9 SCORE 0, GAD7 SCORE 0. Screening results negative for tested conditions. Continue to monitor.  Screening for cervical cancer -     Cytology - PAP  Diabetes mellitus screening -     Hemoglobin A1c  Lipid screening -     Lipid panel  This plan was discussed with the patient and questions were answered. There were no further concerns.  Follow up as indicated, or sooner should any new problems arise, if conditions worsen, or if they are otherwise concerned.   See patient instructions for additional information.  Aaden Buckman Howell Pringle, MD  Family Medicine      Future Appointments  Date Time Provider Department Center  11/10/2023 11:20 AM Jackolyn Confer, MD [facility]

## 2023-09-07 ENCOUNTER — Other Ambulatory Visit: Payer: Self-pay

## 2023-09-07 ENCOUNTER — Encounter: Payer: Self-pay | Admitting: Pediatrics

## 2023-09-07 DIAGNOSIS — R7303 Prediabetes: Secondary | ICD-10-CM | POA: Insufficient documentation

## 2023-09-07 LAB — COMPREHENSIVE METABOLIC PANEL
ALT: 37 [IU]/L — ABNORMAL HIGH (ref 0–32)
AST: 37 [IU]/L (ref 0–40)
Albumin: 4.2 g/dL (ref 3.9–4.9)
Alkaline Phosphatase: 88 [IU]/L (ref 44–121)
BUN/Creatinine Ratio: 23 (ref 9–23)
BUN: 16 mg/dL (ref 6–24)
Bilirubin Total: 0.2 mg/dL (ref 0.0–1.2)
CO2: 19 mmol/L — ABNORMAL LOW (ref 20–29)
Calcium: 9.4 mg/dL (ref 8.7–10.2)
Chloride: 101 mmol/L (ref 96–106)
Creatinine, Ser: 0.71 mg/dL (ref 0.57–1.00)
Globulin, Total: 2.9 g/dL (ref 1.5–4.5)
Glucose: 98 mg/dL (ref 70–99)
Potassium: 4.7 mmol/L (ref 3.5–5.2)
Sodium: 139 mmol/L (ref 134–144)
Total Protein: 7.1 g/dL (ref 6.0–8.5)
eGFR: 108 mL/min/{1.73_m2} (ref >59)

## 2023-09-07 LAB — HEMOGLOBIN A1C
Est. average glucose Bld gHb Est-mCnc: 128 mg/dL
Hgb A1c MFr Bld: 6.1 % — ABNORMAL HIGH (ref 4.8–5.6)

## 2023-09-07 LAB — CBC
Hematocrit: 40 % (ref 34.0–46.6)
Hemoglobin: 12.7 g/dL (ref 11.1–15.9)
MCH: 30.4 pg (ref 26.6–33.0)
MCHC: 31.8 g/dL (ref 31.5–35.7)
MCV: 96 fL (ref 79–97)
Platelets: 339 10*3/uL (ref 150–450)
RBC: 4.18 x10E6/uL (ref 3.77–5.28)
RDW: 12.5 % (ref 11.7–15.4)
WBC: 9 10*3/uL (ref 3.4–10.8)

## 2023-09-07 LAB — LIPID PANEL
Chol/HDL Ratio: 3.9 {ratio} (ref 0.0–4.4)
Cholesterol, Total: 209 mg/dL — ABNORMAL HIGH (ref 100–199)
HDL: 54 mg/dL (ref >39)
LDL Chol Calc (NIH): 136 mg/dL — ABNORMAL HIGH (ref 0–99)
Triglycerides: 105 mg/dL (ref 0–149)
VLDL Cholesterol Cal: 19 mg/dL (ref 5–40)

## 2023-09-07 LAB — TSH: TSH: 0.832 u[IU]/mL (ref 0.450–4.500)

## 2023-09-08 LAB — CYTOLOGY - PAP
Adequacy: ABSENT
Comment: NEGATIVE
Diagnosis: NEGATIVE
High risk HPV: NEGATIVE

## 2023-09-28 ENCOUNTER — Other Ambulatory Visit: Payer: Self-pay | Admitting: Pediatrics

## 2023-09-28 ENCOUNTER — Other Ambulatory Visit: Payer: Self-pay

## 2023-09-28 DIAGNOSIS — Z6838 Body mass index (BMI) 38.0-38.9, adult: Secondary | ICD-10-CM

## 2023-09-29 ENCOUNTER — Other Ambulatory Visit: Payer: Self-pay

## 2023-09-29 ENCOUNTER — Ambulatory Visit
Admission: RE | Admit: 2023-09-29 | Discharge: 2023-09-29 | Disposition: A | Discharge status: Home / Self Care | Payer: 59 | Source: Ambulatory Visit | Attending: Pediatrics | Admitting: Pediatrics

## 2023-09-29 DIAGNOSIS — Z1231 Encounter for screening mammogram for malignant neoplasm of breast: Secondary | ICD-10-CM | POA: Insufficient documentation

## 2023-09-30 ENCOUNTER — Other Ambulatory Visit: Payer: Self-pay | Admitting: Pediatrics

## 2023-09-30 ENCOUNTER — Other Ambulatory Visit: Payer: Self-pay

## 2023-09-30 DIAGNOSIS — R928 Other abnormal and inconclusive findings on diagnostic imaging of breast: Secondary | ICD-10-CM

## 2023-09-30 NOTE — Telephone Encounter (Signed)
 Requested medication (s) are due for refill today - no  Requested medication (s) are on the active medication list -no  Future visit scheduled -yes  Last refill: na  Notes to clinic: Pharmacy comment: pt cannot afford WEgovy  - please resend script for Contrave    Requested Prescriptions  Pending Prescriptions Disp Refills   Naltrexone -buPROPion  HCl ER (CONTRAVE ) 8-90 MG TB12 120 tablet 1    Sig: Take 1 tablet by mouth every morning for 7 days, THEN 1 tablet 2 (two) times daily for 7 days, THEN 3 tablets (2 tablets in the morning and 1 tablet in the evening) daily     Off-Protocol Failed - 09/30/2023  8:29 AM      Failed - Medication not assigned to a protocol, review manually.      Passed - Valid encounter within last 12 months    Recent Outpatient Visits           3 weeks ago Annual physical exam   [place] [facility] Herold Hadassah SQUIBB, MD   1 month ago Encounter to establish care   [place] [facility] Herold Hadassah SQUIBB, MD       Future Appointments             In 1 month Herold, Hadassah SQUIBB, MD [facility], [facility]               Requested Prescriptions  Pending Prescriptions Disp Refills   Naltrexone -buPROPion  HCl ER (CONTRAVE ) 8-90 MG TB12 120 tablet 1    Sig: Take 1 tablet by mouth every morning for 7 days, THEN 1 tablet 2 (two) times daily for 7 days, THEN 3 tablets (2 tablets in the morning and 1 tablet in the evening) daily     Off-Protocol Failed - 09/30/2023  8:29 AM      Failed - Medication not assigned to a protocol, review manually.      Passed - Valid encounter within last 12 months    Recent Outpatient Visits           3 weeks ago Annual physical exam   [place] [facility] Herold Hadassah SQUIBB, MD   1 month ago Encounter to establish care    [facility] Herold Hadassah SQUIBB, MD       Future Appointments             In 1 month Herold, Hadassah SQUIBB, MD [facility]  [facility], [facility]

## 2023-10-04 ENCOUNTER — Ambulatory Visit
Admission: RE | Admit: 2023-10-04 | Discharge: 2023-10-04 | Discharge status: Home / Self Care | Payer: 59 | Source: Ambulatory Visit | Attending: Pediatrics | Admitting: Pediatrics

## 2023-10-04 ENCOUNTER — Ambulatory Visit
Admission: RE | Admit: 2023-10-04 | Discharge: 2023-10-04 | Disposition: A | Discharge status: Home / Self Care | Payer: 59 | Source: Ambulatory Visit | Attending: Pediatrics | Admitting: Pediatrics

## 2023-10-04 DIAGNOSIS — R92333 Mammographic heterogeneous density, bilateral breasts: Secondary | ICD-10-CM | POA: Diagnosis not present

## 2023-10-04 DIAGNOSIS — R928 Other abnormal and inconclusive findings on diagnostic imaging of breast: Secondary | ICD-10-CM | POA: Insufficient documentation

## 2023-10-13 ENCOUNTER — Other Ambulatory Visit: Payer: Self-pay

## 2023-10-13 MED ORDER — NAPROXEN 250 MG PO TABS
ORAL_TABLET | ORAL | 0 refills | Status: DC
  Filled 2023-10-19: qty 16, 5d supply, fill #0

## 2023-10-13 MED ORDER — CHLORHEXIDINE GLUCONATE 0.12 % MT SOLN
Freq: Two times a day (BID) | OROMUCOSAL | 12 refills | Status: DC
  Filled 2023-10-13: qty 473, 16d supply, fill #0

## 2023-10-13 MED ORDER — AMOXICILLIN 500 MG PO CAPS
500.0000 mg | ORAL_CAPSULE | Freq: Four times a day (QID) | ORAL | 0 refills | Status: DC
  Filled 2023-10-13: qty 40, 10d supply, fill #0

## 2023-10-19 ENCOUNTER — Other Ambulatory Visit: Payer: Self-pay

## 2023-10-20 ENCOUNTER — Other Ambulatory Visit: Payer: Self-pay

## 2023-10-20 NOTE — Progress Notes (Addendum)
Clinical coverage for weight loss GLP's   Medication being dispensed is Wegovy 3 mL/28 day. Titration doses are 2 mL/28 days.   [x]  Product being prescribed is FDA approved for the indication, age, weight (if applicable) and not does not exceed dosing limits per the Prescribing Information per the clinical conditions for use.  [x]  Patient's baseline weight measured within the last 45 days as required by provider before dispensing.  [x]  Patient is new to therapy and One of the following:   []  The beneficiary is 44 years of age or over and has ONE of the following:  [x]  A BMI greater than or equal to 30 kg/m2  []  A BMI greater than or equal to 27 kg/m2 with at least one weight-related comorbidity/risk factor/complication (i.e. hypertension, type 2 diabetes, obstructive sleep apnea, cardiovascular disease, dyslipidemia)  If patient has one weight-related comorbidity/risk factor/complication (i.e. hypertension, type 2 diabetes, obstructive sleep apnea, cardiovascular disease, dyslipidemia), please list. Patient suffers from weight-related comorbidity/risk factor/complication, prediabetes.   Last BMI/Weight/Height recorded Estimated body mass index is 38.31 kg/m as calculated from the following:   Height as of this encounter: 5' 4.8" (1.646 m).   Weight as of this encounter: 228 lb 12.8 oz (103.8 kg).

## 2023-10-21 ENCOUNTER — Other Ambulatory Visit: Payer: Self-pay

## 2023-11-10 ENCOUNTER — Encounter: Payer: Self-pay | Admitting: Pediatrics

## 2023-11-10 ENCOUNTER — Telehealth: Payer: 59 | Admitting: Pediatrics

## 2023-11-10 DIAGNOSIS — R7303 Prediabetes: Secondary | ICD-10-CM | POA: Diagnosis not present

## 2023-11-10 DIAGNOSIS — Z6838 Body mass index (BMI) 38.0-38.9, adult: Secondary | ICD-10-CM | POA: Diagnosis not present

## 2023-11-10 DIAGNOSIS — N393 Stress incontinence (female) (male): Secondary | ICD-10-CM | POA: Diagnosis not present

## 2023-11-10 MED ORDER — PHENTERMINE HCL 15 MG PO CAPS: 15.0000 mg | ORAL_CAPSULE | ORAL | 1 refills | Status: DC

## 2023-11-10 NOTE — Patient Instructions (Signed)
Sent phentermine 15mg  to take daily for appetite suppression.

## 2023-11-10 NOTE — Assessment & Plan Note (Signed)
Previous trials of Wegovy and Contrave were unsuccessful due to cost and side effects (nausea). Patient reports increased hunger with exercise and difficulty controlling sweet intake. -Start Phentermine for appetite suppression. -Consider adding Topiramate in the future for nighttime cravings. -Investigate potential coverage for injectable weight loss medications with patient's insurance. -Schedule follow-up in 1 month to assess response to Phentermine.

## 2023-11-10 NOTE — Progress Notes (Signed)
Telehealth Visit  I connected with  Mary Conner on 11/10/23 by a video enabled telemedicine application and verified that I am speaking with the correct person using two identifiers.   I discussed the limitations of evaluation and management by telemedicine. The patient expressed understanding and agreed to proceed.  Subjective:    Patient ID: Mary Conner, female    DOB: 01-17-1981, 43 y.o.   MRN: [id number]  HPI: Mary Conner is a 43 y.o. female  Chief Complaint  Patient presents with   Obesity    Patient states she would like to discuss her weight. States she started on Contrave but states it wasn't working very well and did make her very nauseous.    Urinary Incontinence    Patient states she would like to discuss this as it has been going on for the last few years but has gotten worse per the patient.     Discussed the use of AI scribe software for clinical note transcription with the patient, who gave verbal consent to proceed.  History of Present Illness   Mary Conner is a 43 year old female who presents with weight management issues and urinary incontinence.  She has been facing ongoing challenges with weight management. Initially, she tried [place], but it was not covered by her insurance and was too expensive. She then attempted to use Contrave, but experienced significant nausea and found it ineffective. Previously, she used phentermine, which was effective but became too costly. She is interested in resuming phentermine as it was beneficial in the past. 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  She describes recent worsening of urinary incontinence, particularly during episodes of coughing associated with a recent flu illness. The incontinence was severe enough to cause urination during coughing fits. She recalls being referred to pelvic floor physical therapy but did not follow up with the referral. She is  concerned about the worsening of her symptoms and the impact on her daily life.  She recently experienced a severe flu, which she initially suspected to be COVID-19. Her daughter tested positive for the flu, confirming the diagnosis. This illness kept her out of work for about two weeks. During this time, she experienced increased urinary incontinence due to persistent coughing.  In terms of her social history, she works for [employer] and has been managing her weight through exercise, although she notes increased hunger and sweet cravings post-exercise. Her daughter was also sick with the flu, which affected the household.     Relevant past medical, surgical, family and social history reviewed and updated as indicated. Interim medical history since our last visit reviewed. Allergies and medications reviewed and updated.  ROS per HPI unless specifically indicated above     Objective:    There were no vitals taken for this visit.  Wt Readings from Last 3 Encounters:  09/06/23 228 lb 12.8 oz (103.8 kg)  08/05/23 228 lb 6.4 oz (103.6 kg)  02/08/22 230 lb (104.3 kg)     Physical Exam Constitutional:      General: She is not in acute distress.    Appearance: Normal appearance.  Neurological:     General: No focal deficit present.     Mental Status: She is alert. Mental status is at baseline.    LIMITED EXAM GIVEN VIDEO VISIT     Assessment & Plan:  Assessment & Plan   Prediabetes BMI 38.0-38.9,adult Assessment & Plan: Previous trials of Wegovy and Contrave  were unsuccessful due to cost and side effects (nausea). Patient reports increased hunger with exercise and difficulty controlling sweet intake. -Start Phentermine for appetite suppression. -Consider adding Topiramate in the future for nighttime cravings. -Investigate potential coverage for injectable weight loss medications with patient's insurance. -Schedule follow-up in 1 month to assess response to Phentermine.   Orders: -      Phentermine HCl; Take 1 capsule (15 mg total) by mouth every morning.  Dispense: 30 capsule; Refill: 1  Stress incontinence Assessment & Plan: Increased severity with coughing during recent illness. Previously referred to pelvic floor physical therapy but patient did not follow up. Discussed the benefits of urogynecology evaluation, referral placed. -Resend referral to pelvic floor physical therapy. -Refer to urogynecology for further evaluation and management.  Orders: -     Ambulatory referral to Urogynecology -     Ambulatory referral to Physical Therapy  Follow up plan: Return in about 4 weeks (around 12/08/2023) for wt management.  Mary Confer, MD   This visit was completed via video visit through MyChart due to the restrictions of the COVID-19 pandemic. All issues as above were discussed and addressed. Physical exam was done as above through visual confirmation on video through MyChart. If it was felt that the patient should be evaluated in the office, they were directed there. The patient verbally consented to this visit."} Location of the patient: home Location of the provider: home Those involved with this call:  Provider: Modena Nunnery, MD CMA: Wilhemena Durie, CMA Time spent on call:  10 minutes with patient face to face via video conference. More than 50% of this time was spent in counseling and coordination of care. 20 minutes total spent in review of patient's record and preparation of their chart. Total time spent on this encounter: 30 minutes.

## 2023-11-10 NOTE — Assessment & Plan Note (Addendum)
Increased severity with coughing during recent illness. Previously referred to pelvic floor physical therapy but patient did not follow up. Discussed the benefits of urogynecology evaluation, referral placed. -Resend referral to pelvic floor physical therapy. -Refer to urogynecology for further evaluation and management.

## 2023-11-18 ENCOUNTER — Other Ambulatory Visit: Payer: Self-pay

## 2023-11-18 ENCOUNTER — Other Ambulatory Visit: Payer: Self-pay | Admitting: Pediatrics

## 2023-11-18 DIAGNOSIS — R7303 Prediabetes: Secondary | ICD-10-CM

## 2023-11-18 DIAGNOSIS — Z6838 Body mass index (BMI) 38.0-38.9, adult: Secondary | ICD-10-CM

## 2023-11-18 MED ORDER — PHENTERMINE HCL 30 MG PO CAPS
30.0000 mg | ORAL_CAPSULE | ORAL | 0 refills | Status: DC
  Filled 2023-11-18: qty 30, 30d supply, fill #0

## 2023-11-24 ENCOUNTER — Other Ambulatory Visit: Payer: Self-pay

## 2023-12-15 ENCOUNTER — Other Ambulatory Visit: Payer: Self-pay | Admitting: Pediatrics

## 2023-12-15 ENCOUNTER — Other Ambulatory Visit: Payer: Self-pay

## 2023-12-15 DIAGNOSIS — Z6838 Body mass index (BMI) 38.0-38.9, adult: Secondary | ICD-10-CM

## 2023-12-15 DIAGNOSIS — R7303 Prediabetes: Secondary | ICD-10-CM

## 2023-12-15 NOTE — Telephone Encounter (Signed)
 Requested medication (s) are due for refill today: Yes  Requested medication (s) are on the active medication list: Yes  Last refill:  11/18/23  Future visit scheduled: No  Notes to clinic:  Unable to refill per protocol, cannot delegate.      Requested Prescriptions  Pending Prescriptions Disp Refills   phentermine 30 MG capsule 30 capsule 0    Sig: Take 1 capsule (30 mg total) by mouth every morning.     Not Delegated - Neurology: Anticonvulsants - Controlled - phentermine hydrochloride Failed - 12/15/2023  4:01 PM      Failed - This refill cannot be delegated      Failed - Weight completed in the last 3 months    Wt Readings from Last 1 Encounters:  09/06/23 228 lb 12.8 oz (103.8 kg)         Passed - eGFR in normal range and within 360 days    GFR  Date Value Ref Range Status  01/24/2020 84.19 >60.00 mL/min Final   eGFR  Date Value Ref Range Status  09/06/2023 108 >59 mL/min/1.73 Final         Passed - Cr in normal range and within 360 days    Creatinine, Ser  Date Value Ref Range Status  09/06/2023 0.71 0.57 - 1.00 mg/dL Final         Passed - Last BP in normal range    BP Readings from Last 1 Encounters:  09/06/23 133/83         Passed - Valid encounter within last 6 months    Recent Outpatient Visits           1 month ago Prediabetes   [place] [facility] Jackolyn Confer, MD       Future Appointments             In 2 months Cordelia Pen, Joan Mayans, NP [facility] Urogynecology at [facility], [facility]

## 2023-12-16 ENCOUNTER — Other Ambulatory Visit: Payer: Self-pay

## 2023-12-16 MED FILL — Phentermine HCl Cap 30 MG: ORAL | 30 days supply | Qty: 30 | Fill #0 | Status: CN

## 2023-12-18 ENCOUNTER — Other Ambulatory Visit: Payer: Self-pay

## 2023-12-19 ENCOUNTER — Other Ambulatory Visit: Payer: Self-pay

## 2023-12-20 ENCOUNTER — Other Ambulatory Visit: Payer: Self-pay

## 2023-12-21 ENCOUNTER — Other Ambulatory Visit: Payer: Self-pay

## 2023-12-21 MED FILL — Phentermine HCl Cap 30 MG: ORAL | 30 days supply | Qty: 30 | Fill #0 | Status: AC

## 2024-01-09 NOTE — Telephone Encounter (Signed)
 Appt scheduled

## 2024-01-24 ENCOUNTER — Ambulatory Visit (INDEPENDENT_AMBULATORY_CARE_PROVIDER_SITE_OTHER): Admitting: Pediatrics

## 2024-01-24 ENCOUNTER — Encounter: Payer: Self-pay | Admitting: Pediatrics

## 2024-01-24 VITALS — BP 153/103 | HR 86 | Temp 97.8°F | Wt 229.0 lb

## 2024-01-24 DIAGNOSIS — R7303 Prediabetes: Secondary | ICD-10-CM

## 2024-01-24 DIAGNOSIS — R030 Elevated blood-pressure reading, without diagnosis of hypertension: Secondary | ICD-10-CM

## 2024-01-24 DIAGNOSIS — Z6838 Body mass index (BMI) 38.0-38.9, adult: Secondary | ICD-10-CM

## 2024-01-24 MED ORDER — WEGOVY 0.25 MG/0.5ML ~~LOC~~ SOAJ: 0.2500 mg | SUBCUTANEOUS | 0 refills | Status: DC

## 2024-01-24 MED ORDER — SEMAGLUTIDE-WEIGHT MANAGEMENT 0.25 MG/0.5ML ~~LOC~~ SOAJ: 0.2500 mg | SUBCUTANEOUS | 0 refills | Status: DC

## 2024-01-24 NOTE — Assessment & Plan Note (Addendum)
 Unsuccessful weight loss with phentermine  and keto diet. Discussed alternative weight loss options including injectable medications like Wegovy  and Zepbound. Explored insurance coverage issues and potential alternatives. - Resubmit for coverage of Wegovy  or Zepbound. - Consider alternative oral medications if injectables are not approved

## 2024-01-24 NOTE — Addendum Note (Signed)
 Addended by: Hadassah Letters on: 01/24/2024 09:21 AM   Modules accepted: Orders

## 2024-01-24 NOTE — Progress Notes (Signed)
 Office Visit  BP (!) 153/103   Pulse 86   Temp 97.8 F (36.6 C) (Oral)   Wt 229 lb (103.9 kg)   LMP 01/22/2024 (Exact Date)   SpO2 99%   BMI 38.34 kg/m    Subjective:    Patient ID: Mary Conner, female    DOB: Jan 18, 1981, 43 y.o.   MRN: [id number]  HPI: Mary Conner is a 43 y.o. female  Chief Complaint  Patient presents with   medication mangement   Discussed the use of AI scribe software for clinical note transcription with the patient, who gave verbal consent to proceed.  History of Present Illness   Mary Conner is a 43 year old female who presents with difficulty losing weight despite various interventions.  She has been unable to lose weight despite trying multiple interventions, including phentermine  and the keto diet. These methods did not work for her, as she was eating as much and not seeing any changes on the scale. She also tried incorporating sugar-free options to manage her sweet cravings, but still did not see any results. She remains very active but has not experienced weight loss. Her current medication includes phentermine , which she suspects may be contributing to her elevated blood pressure noted during the visit.  Regarding her sleep, she generally sleeps well but experiences difficulty returning to sleep if she wakes up in the middle of the night. She occasionally snores when very tired, as noted by her boyfriend, and feels sleepy in the morning, which she manages with energy drinks. She does not feel tired in the afternoon.  Her insurance coverage includes [employer] and [place], which she has previously used to attempt coverage for weight loss medications like Wegovy , but faced high out-of-pocket costs.      Relevant past medical, surgical, family and social history reviewed and updated as indicated. Interim medical history since our last visit reviewed. Allergies and medications reviewed and updated.  ROS per HPI unless specifically  indicated above     Objective:    BP (!) 153/103   Pulse 86   Temp 97.8 F (36.6 C) (Oral)   Wt 229 lb (103.9 kg)   LMP 01/22/2024 (Exact Date)   SpO2 99%   BMI 38.34 kg/m   Wt Readings from Last 3 Encounters:  01/24/24 229 lb (103.9 kg)  09/06/23 228 lb 12.8 oz (103.8 kg)  08/05/23 228 lb 6.4 oz (103.6 kg)     Physical Exam Constitutional:      Appearance: Normal appearance.  Pulmonary:     Effort: Pulmonary effort is normal.  Musculoskeletal:        General: Normal range of motion.  Skin:    Comments: Normal skin color  Neurological:     General: No focal deficit present.     Mental Status: She is alert. Mental status is at baseline.  Psychiatric:        Mood and Affect: Mood normal.        Behavior: Behavior normal.        Thought Content: Thought content normal.         01/24/2024    8:10 AM 09/06/2023   11:14 AM 08/05/2023    2:01 PM 03/17/2020   11:54 AM 11/02/2019    9:16 AM  Depression screen PHQ 2/9  Decreased Interest 0 0 0 0 0  Down, Depressed, Hopeless 0 0 0 0 0  PHQ - 2 Score 0 0 0 0 0  Altered sleeping 0  0 0    Tired, decreased energy 0 0 0    Change in appetite 1 0 1    Feeling bad or failure about yourself  0 0 0    Trouble concentrating 0 0 0    Moving slowly or fidgety/restless 0 0 0    Suicidal thoughts 0 0 0    PHQ-9 Score 1 0 1    Difficult doing work/chores Not difficult at all Not difficult at all Not difficult at all         01/24/2024    8:12 AM 09/06/2023   11:14 AM 08/05/2023    2:01 PM  GAD 7 : Generalized Anxiety Score  Nervous, Anxious, on Edge 0 0 0  Control/stop worrying 0 0 0  Worry too much - different things 0 0 0  Trouble relaxing 0 0 0  Restless 0 0 0  Easily annoyed or irritable 0 0 0  Afraid - awful might happen 0 0 0  Total GAD 7 Score 0 0 0  Anxiety Difficulty Not difficult at all Not difficult at all Not difficult at all       Assessment & Plan:  Assessment & Plan   BMI  38.0-38.9,adult Prediabetes Assessment & Plan: Unsuccessful weight loss with phentermine  and keto diet. Discussed alternative weight loss options including injectable medications like Wegovy  and Zepbound. Explored insurance coverage issues and potential alternatives. - Resubmit for coverage of Wegovy  or Zepbound. - Consider alternative oral medications if injectables are not approved  Orders: -     Semaglutide -Weight Management; Inject 0.25 mg into the skin once a week.  Dispense: 2 mL; Refill: 0   Elevated blood pressure reading Elevated blood pressure likely secondary to phentermine  use. - Discontinue phentermine  to evaluate its effect on blood pressure in 2 weeks  Clinical coverage for weight loss GLP's  Medication being dispensed is Wegovy  3 mL/28 day. Titration doses are 2 mL/28 days.  [x]  Product being prescribed is FDA approved for the indication, age, weight (if applicable) and not does not exceed dosing limits per the Prescribing Information per the clinical conditions for use.  [x]  Patient's baseline weight measured within the last 45 days as required by provider before dispensing.  [x]  Patient is new to therapy and One of the following:   [x]  The beneficiary is 44 years of age or over and has ONE of the following:  [x]  A BMI greater than or equal to 30 kg/m2  []  A BMI greater than or equal to 27 kg/m2 with at least one weight-related comorbidity/risk factor/complication (i.e. hypertension, type 2 diabetes, obstructive sleep apnea, cardiovascular disease, dyslipidemia)  If patient has one weight-related comorbidity/risk factor/complication (i.e. hypertension, type 2 diabetes, obstructive sleep apnea, cardiovascular disease, dyslipidemia), please list: prediabetes, HTN.    [x]  The beneficiary is currently on and will continue lifestyle modification including structured nutrition and physical activity, unless physical activity is not clinically appropriate at the time GLP1  therapy commences AND  [x]  The beneficiary will NOT be using the requested agent in combination with another GLP-1 receptor agonist agent AND  [x]  The beneficiary does NOT have any FDA-labeled contraindications to the requested agent, including pregnancy, lactation, history of medullary thyroid  cancer or multiple endocrine neoplasia type II.   Last BMI/Weight/Height recorded Estimated body mass index is 38.34 kg/m as calculated from the following:   Height as of 09/06/23: 5' 4.8" (1.646 m).   Weight as of this encounter: 229 lb (103.9 kg).   Follow up plan:  Return in about 2 weeks (around 02/07/2024) for HTN.  Hadassah Letters, MD

## 2024-01-24 NOTE — Patient Instructions (Addendum)
 Resubmit for wegovy  and zepbound You can look into tele health companies Stop phentermine  Other oral options: Oral medications - can do single pill or combined as below Welbutrin/naltrexone  SE: headaches, insomnia, increased anxiety 8-10% weight loss Phentermine  (already done) [employer] SE: headaches, insomnia, increased anxiety 8-10% weight loss With topiramate: nausea is most common Orlistat SE: greasy stools, abdominal pain 2% weight loss

## 2024-02-01 ENCOUNTER — Ambulatory Visit: Admitting: Pediatrics

## 2024-02-02 ENCOUNTER — Telehealth: Admitting: Pediatrics

## 2024-02-07 ENCOUNTER — Other Ambulatory Visit: Payer: Self-pay

## 2024-02-07 MED ORDER — CHLORHEXIDINE GLUCONATE 0.12 % MT SOLN
15.0000 mL | Freq: Two times a day (BID) | OROMUCOSAL | 12 refills | Status: DC
Start: 2024-02-07 — End: 2024-08-02
  Filled 2024-02-07: qty 473, 16d supply, fill #0

## 2024-02-07 MED ORDER — AMOXICILLIN 500 MG PO CAPS
500.0000 mg | ORAL_CAPSULE | Freq: Four times a day (QID) | ORAL | 0 refills | Status: DC
Start: 2024-02-07 — End: 2024-05-17
  Filled 2024-02-07: qty 40, 10d supply, fill #0

## 2024-02-08 ENCOUNTER — Other Ambulatory Visit: Payer: Self-pay

## 2024-02-08 ENCOUNTER — Ambulatory Visit (INDEPENDENT_AMBULATORY_CARE_PROVIDER_SITE_OTHER): Admitting: Pediatrics

## 2024-02-08 ENCOUNTER — Encounter: Payer: Self-pay | Admitting: Pediatrics

## 2024-02-08 VITALS — BP 135/86 | HR 78 | Temp 97.7°F | Wt 237.2 lb

## 2024-02-08 DIAGNOSIS — Z6838 Body mass index (BMI) 38.0-38.9, adult: Secondary | ICD-10-CM

## 2024-02-08 DIAGNOSIS — R7303 Prediabetes: Secondary | ICD-10-CM

## 2024-02-08 DIAGNOSIS — J3089 Other allergic rhinitis: Secondary | ICD-10-CM | POA: Diagnosis not present

## 2024-02-08 DIAGNOSIS — I10 Essential (primary) hypertension: Secondary | ICD-10-CM | POA: Insufficient documentation

## 2024-02-08 MED ORDER — FLUTICASONE PROPIONATE 50 MCG/ACT NA SUSP
2.0000 | Freq: Every day | NASAL | 6 refills | Status: DC
  Filled 2024-02-08: qty 16, 30d supply, fill #0

## 2024-02-08 MED ORDER — NALTREXONE-BUPROPION HCL ER 8-90 MG PO TB12
ORAL_TABLET | ORAL | 1 refills | Status: DC
  Filled 2024-02-08 – 2024-02-10 (×2): qty 120, 30d supply, fill #0
  Filled 2024-02-15: qty 120, 47d supply, fill #0

## 2024-02-08 NOTE — Assessment & Plan Note (Signed)
 Discussed weight management options. Contrave  effective previously and covered by insurance. Wegovy  not covered by current insurance but may be if Medicaid becomes primary. Discussed out-of-pocket options like Rho and Zepbound. - Prescribe Contrave  and titrate dose as needed. - Instruct her to contact Medicaid about primary coverage for Wegovy . - Submit prescription for Wegovy  if insurance coverage changes.

## 2024-02-08 NOTE — Progress Notes (Signed)
 Office Visit  BP 135/86   Pulse 78   Temp 97.7 F (36.5 C) (Oral)   Wt 237 lb 3.2 oz (107.6 kg)   LMP 01/22/2024 (Exact Date)   SpO2 98%   BMI 39.72 kg/m    Subjective:    Patient ID: Mary Conner, female    DOB: 1980-11-28, 43 y.o.   MRN: [id number]  HPI: Mary Conner is a 43 y.o. female  Chief Complaint  Patient presents with   Hypertension    Pt is rechecking BP last visit BP was high    Discussed the use of AI scribe software for clinical note transcription with the patient, who gave verbal consent to proceed.  History of Present Illness   Mary Conner is a 43 year old female with hypertension who presents with uncontrolled allergies.  She has been experiencing uncontrolled allergies and is currently using over-the-counter medications, specifically a generic brand of Claritin and Zyrtec, switching between them as needed. Sometimes these medications are effective, but at other times they are not. She also has Flonase  but forgot it at work and notes it was expensive.  She has a history of hypertension. She previously stopped taking phentermine , which she felt gave her a boost of energy and helped control her appetite, but upon stopping, she felt more lethargic and experienced increased hunger. She recalls using Contrave  in the past, which was covered by her insurance and was effective, although it caused some nausea.  She is currently dealing with insurance issues regarding coverage for Wegovy , a medication she is interested in for weight management.     Relevant past medical, surgical, family and social history reviewed and updated as indicated. Interim medical history since our last visit reviewed. Allergies and medications reviewed and updated.  ROS per HPI unless specifically indicated above     Objective:     BP 135/86   Pulse 78   Temp 97.7 F (36.5 C) (Oral)   Wt 237 lb 3.2 oz (107.6 kg)   LMP 01/22/2024 (Exact Date)   SpO2 98%    BMI 39.72 kg/m   Wt Readings from Last 3 Encounters:  02/08/24 237 lb 3.2 oz (107.6 kg)  01/24/24 229 lb (103.9 kg)  09/06/23 228 lb 12.8 oz (103.8 kg)     Physical Exam Constitutional:      Appearance: Normal appearance.  Pulmonary:     Effort: Pulmonary effort is normal.  Musculoskeletal:        General: Normal range of motion.  Skin:    Comments: Normal skin color  Neurological:     General: No focal deficit present.     Mental Status: She is alert. Mental status is at baseline.  Psychiatric:        Mood and Affect: Mood normal.        Behavior: Behavior normal.        Thought Content: Thought content normal.         02/08/2024    8:14 AM 01/24/2024    8:10 AM 09/06/2023   11:14 AM 08/05/2023    2:01 PM 03/17/2020   11:54 AM  Depression screen PHQ 2/9  Decreased Interest 0 0 0 0 0  Down, Depressed, Hopeless 0 0 0 0 0  PHQ - 2 Score 0 0 0 0 0  Altered sleeping 0 0 0 0   Tired, decreased energy 0 0 0 0   Change in appetite 0 1 0 1   Feeling bad or  failure about yourself  0 0 0 0   Trouble concentrating 0 0 0 0   Moving slowly or fidgety/restless 0 0 0 0   Suicidal thoughts 0 0 0 0   PHQ-9 Score 0 1 0 1   Difficult doing work/chores Not difficult at all Not difficult at all Not difficult at all Not difficult at all        02/08/2024    8:15 AM 01/24/2024    8:12 AM 09/06/2023   11:14 AM 08/05/2023    2:01 PM  GAD 7 : Generalized Anxiety Score  Nervous, Anxious, on Edge 0 0 0 0  Control/stop worrying 0 0 0 0  Worry too much - different things 0 0 0 0  Trouble relaxing 0 0 0 0  Restless 0 0 0 0  Easily annoyed or irritable 0 0 0 0  Afraid - awful might happen 0 0 0 0  Total GAD 7 Score 0 0 0 0  Anxiety Difficulty Not difficult at all Not difficult at all Not difficult at all Not difficult at all       Assessment & Plan:  Assessment & Plan   BMI 38.0-38.9,adult Prediabetes Assessment & Plan: Discussed weight management options. Contrave  effective  previously and covered by insurance. Wegovy  not covered by current insurance but may be if Medicaid becomes primary. Discussed out-of-pocket options like Rho and Zepbound. - Prescribe Contrave  and titrate dose as needed. - Instruct her to contact Medicaid about primary coverage for Wegovy . - Submit prescription for Wegovy  if insurance coverage changes.  Orders: -     Naltrexone -buPROPion  HCl ER; Start 1 tablet every morning for 7 days, then 1 tablet twice daily for 7 days, then 2 tablets every morning and one in the evening  Dispense: 120 tablet; Refill: 1   Primary hypertension Assessment & Plan: Blood pressure slightly elevated, not requiring medication. Monitor as it is borderline. Contrave  does not significantly affect blood pressure. - Monitor blood pressure regularly. - Follow up in two weeks if blood pressure increases.   Non-seasonal allergic rhinitis, unspecified trigger Assessment & Plan: Symptoms not controlled with OTC medications. Uses loratadine and cetirizine interchangeably. Fluticasone  available but not used due to being left at work. Insurance coverage for fluticasone  will be explored. - Encourage consistent use of fluticasone  for symptom control.   Orders: -     Fluticasone  Propionate; Place 2 sprays into both nostrils daily.  Dispense: 16 g; Refill: 6     Follow up plan: Return in about 3 months (around 05/10/2024).  Hadassah Letters, MD

## 2024-02-08 NOTE — Assessment & Plan Note (Addendum)
 Symptoms not controlled with OTC medications. Uses loratadine and cetirizine interchangeably. Fluticasone  available but not used due to being left at work. Insurance coverage for fluticasone  will be explored. - Encourage consistent use of fluticasone  for symptom control.

## 2024-02-08 NOTE — Patient Instructions (Signed)
 Restart contrave

## 2024-02-08 NOTE — Assessment & Plan Note (Signed)
 Blood pressure slightly elevated, not requiring medication. Monitor as it is borderline. Contrave  does not significantly affect blood pressure. - Monitor blood pressure regularly. - Follow up in two weeks if blood pressure increases.

## 2024-02-10 ENCOUNTER — Other Ambulatory Visit: Payer: Self-pay

## 2024-02-10 ENCOUNTER — Telehealth: Payer: Self-pay

## 2024-02-10 NOTE — Telephone Encounter (Signed)
 Pharmacy Patient Advocate Encounter   Received notification from CoverMyMeds that prior authorization for Contrave  8-90MG  er tablets is required/requested.   Insurance verification completed.   The patient is insured through [facility] .   Per test claim: PA required; PA submitted to above mentioned insurance via CoverMyMeds Key/confirmation #/EOC [id number] Status is pending

## 2024-02-15 ENCOUNTER — Other Ambulatory Visit: Payer: Self-pay

## 2024-02-15 NOTE — Telephone Encounter (Signed)
 Pharmacy Patient Advocate Encounter  Received notification from [facility] that Prior Authorization for Contrave  8-90MG  er tablets has been APPROVED from 02/14/2024 to 03/15/2024   PA #/Case ID/Reference #: [id number]

## 2024-02-16 ENCOUNTER — Other Ambulatory Visit: Payer: Self-pay | Admitting: Pediatrics

## 2024-02-16 DIAGNOSIS — N6489 Other specified disorders of breast: Secondary | ICD-10-CM

## 2024-02-21 ENCOUNTER — Other Ambulatory Visit: Payer: Self-pay | Admitting: Pediatrics

## 2024-02-21 ENCOUNTER — Other Ambulatory Visit: Payer: Self-pay

## 2024-02-21 DIAGNOSIS — Z6838 Body mass index (BMI) 38.0-38.9, adult: Secondary | ICD-10-CM

## 2024-02-21 MED ORDER — TOPIRAMATE 50 MG PO TABS
50.0000 mg | ORAL_TABLET | Freq: Every day | ORAL | 1 refills | Status: DC
Start: 2024-02-21 — End: 2024-03-29
  Filled 2024-02-21: qty 30, 30d supply, fill #0
  Filled 2024-03-21: qty 30, 30d supply, fill #1

## 2024-02-21 MED ORDER — PHENTERMINE HCL 15 MG PO CAPS
15.0000 mg | ORAL_CAPSULE | ORAL | 1 refills | Status: DC
  Filled 2024-02-21: qty 30, 30d supply, fill #0
  Filled 2024-03-21: qty 30, 30d supply, fill #1

## 2024-02-21 NOTE — Progress Notes (Signed)
 Patient would like to switch from contrave  to phentermine /top combination for wt management.   Hadassah Letters, MD

## 2024-02-22 NOTE — Telephone Encounter (Signed)
 Appt scheduled

## 2024-03-01 ENCOUNTER — Encounter: Payer: Self-pay | Admitting: Obstetrics and Gynecology

## 2024-03-01 ENCOUNTER — Ambulatory Visit: Payer: 59 | Admitting: Obstetrics and Gynecology

## 2024-03-01 VITALS — BP 133/89 | HR 86

## 2024-03-01 DIAGNOSIS — N393 Stress incontinence (female) (male): Secondary | ICD-10-CM | POA: Diagnosis not present

## 2024-03-01 DIAGNOSIS — R350 Frequency of micturition: Secondary | ICD-10-CM | POA: Diagnosis not present

## 2024-03-01 LAB — POCT URINALYSIS DIP (CLINITEK)
Bilirubin, UA: NEGATIVE
Glucose, UA: NEGATIVE mg/dL
Ketones, POC UA: NEGATIVE mg/dL
Leukocytes, UA: NEGATIVE
Nitrite, UA: NEGATIVE
POC PROTEIN,UA: NEGATIVE
Spec Grav, UA: 1.02 (ref 1.010–1.025)
Urobilinogen, UA: 0.2 U/dL
pH, UA: 5.5 (ref 5.0–8.0)

## 2024-03-01 NOTE — Patient Instructions (Addendum)
 Today we talked about ways to manage bladder urgency such as altering your diet to avoid irritative beverages and foods (bladder diet) as well as attempting to decrease stress and other exacerbating factors.  You can also chew a plain Tums 1-3 times per day to make your urine less acidic, especially if you have eating/drinking acidic things.    The Most Bothersome Foods* The Least Bothersome Foods*  Coffee - Regular & Decaf Tea - caffeinated Carbonated beverages - cola, non-colas, diet & caffeine-free Alcohols - Beer, Red Wine, White Wine, [address] - Grapefruit, [place], Orange, [employer] - Cranberry, Grapefruit, Orange, Pineapple Vegetables - Tomato & Tomato Products Flavor Enhancers - Hot peppers, Spicy foods, Chili, Horseradish, Vinegar, Monosodium glutamate (MSG) Artificial Sweeteners - NutraSweet, Sweet 'N Low, Equal (sweetener), Saccharin Ethnic foods - [place], [place], [place] food [employer] - low-fat & whole Fruits - Bananas, Blueberries, Honeydew melon, Pears, Raisins, Watermelon Vegetables - Broccoli, [address] Sprouts, [place], Carrots, Cauliflower, Gantt, Cucumber, Mushrooms, Peas, Radishes, Squash, Zucchini, White potatoes, Sweet potatoes & yams Poultry - Chicken, Eggs, [place], [employer] - Beef, Diplomatic Services operational officer, Lamb Seafood - Shrimp, [place] fish, Salmon Grains - Oat, Rice Snacks - Pretzels, Popcorn  *Lenward Chancellor et al. Diet and its role in interstitial cystitis/bladder pain syndrome (IC/BPS) and comorbid conditions. [employer]. [employer] Int. 2012 Jan 11.

## 2024-03-01 NOTE — Progress Notes (Signed)
 [facility] New Patient Evaluation and Consultation  Referring Provider: Hadassah Letters, MD PCP: Hadassah Letters, MD Date of Service: 03/01/2024  SUBJECTIVE Chief Complaint: New Patient (Initial Visit) Mary Conner is a 43 y.o. female  here today for stress incontinence)  History of Present Illness: Mary Conner is a 43 y.o. Hispanic female seen in consultation at the request of Dr. Juliette Oh for evaluation of SUI.    Review of records significant for: Has tried OAB medication  Urinary Symptoms: Leaks urine with cough/ sneeze, laughing, exercise, lifting, and during sex Leaks few time(s) per days.  Pad use: 1 liners/ mini-pads per day.   Patient is bothered by UI symptoms.  Day time voids 6.  Nocturia: 1 times per night to void. Voiding dysfunction:  empties bladder well.  Patient does not use a catheter to empty bladder.  When urinating, patient feels the need to urinate multiple times in a row Drinks: Water, Juice, Energy drinks, and zero sugar soda per day  UTIs: 0 UTI's in the last year.   Denies history of blood in urine, kidney or bladder stones, pyelonephritis, bladder cancer, and kidney cancer No results found for the last 90 days.   Pelvic Organ Prolapse Symptoms:                  Patient Admits to a feeling of a bulge the vaginal area. It has been present for 9 years.  Patient Denies seeing a bulge.  This bulge is bothersome.  Bowel Symptom: Bowel movements: 2 time(s) per day Stool consistency: hard Straining: yes.  Splinting: no.  Incomplete evacuation: yes.  Patient Denies accidental bowel leakage / fecal incontinence Bowel regimen: Detox Tea Last colonoscopy: Date NA HM Colonoscopy   This patient has no relevant Health Maintenance data.     Sexual Function Sexually active: yes.  Sexual orientation: Straight Pain with sex: No  Pelvic Pain Denies pelvic pain    Past Medical History:  Past Medical History:  Diagnosis  Date   Allergic rhinitis due to pollen 08/05/2023   Nail fungus 11/02/2019   Other chronic allergic conjunctivitis 08/05/2023     Past Surgical History:   Past Surgical History:  Procedure Laterality Date   APPENDECTOMY     CHOLECYSTECTOMY     CYST EXCISION       Past OB/GYN History: [id number] Menopausal: No, LMP No LMP recorded. Contraception: None. Last pap smear was March 2025.  Any history of abnormal pap smears: no. HM PAP   This patient has no relevant Health Maintenance data.     Medications: Patient has a current medication list which includes the following prescription(s): amoxicillin , chlorhexidine , fluticasone , phentermine , and topiramate .   Allergies: Patient has no known allergies.   Social History:  Social History   Tobacco Use   Smoking status: Never   Smokeless tobacco: Never  Vaping Use   Vaping status: Never Used  Substance Use Topics   Alcohol use: Yes    Comment: occasionally   Drug use: No    Relationship status: long-term partner Patient lives with significant other.   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 Regular exercise: Yes: Spin class and walking History of abuse: No  Family History:   Family History  Problem Relation Age of Onset   Pancreatic cancer Mother    Breast cancer Neg Hx      Review of Systems: Review of Systems  Constitutional:  Negative for chills and fever.  Respiratory:  Negative for cough  and shortness of breath.   Cardiovascular:  Negative for chest pain and palpitations.  Gastrointestinal:  Negative for abdominal pain, blood in stool, constipation and diarrhea.  Genitourinary:  Positive for dysuria.       +Abnormal periods  Skin:  Negative for rash.  Neurological:  Positive for headaches. Negative for weakness.  Endo/Heme/Allergies:  Bruises/bleeds easily.  Psychiatric/Behavioral:  Negative for depression and suicidal ideas.      OBJECTIVE Physical Exam: Vitals:   03/01/24 1011  BP: 133/89  Pulse: 86     Physical Exam Constitutional:      Appearance: Normal appearance.  Pulmonary:     Effort: Pulmonary effort is normal.  Abdominal:     Palpations: Abdomen is soft.   Skin:    General: Skin is warm and dry.   Neurological:     General: No focal deficit present.     Mental Status: She is alert and oriented to person, place, and time.   Psychiatric:        Mood and Affect: Mood normal.        Behavior: Behavior normal. Behavior is cooperative.        Thought Content: Thought content normal.      GU / Detailed Urogynecologic Evaluation:  Pelvic Exam: Normal external female genitalia; Bartholin's and Skene's glands normal in appearance; urethral meatus normal in appearance, no urethral masses or discharge.   CST: negative* See Simple CMG for Positive result   Speculum exam reveals normal vaginal mucosa without atrophy. Cervix normal appearance. Uterus normal single, nontender. Adnexa normal adnexa.     With apex supported, anterior compartment defect was reduced  Pelvic floor strength III/V   Pelvic floor musculature: Right levator non-tender, Right obturator non-tender, Left levator non-tender, Left obturator non-tender  Verbal consent was obtained to perform simple CMG procedure:   Prolapse was reduced using 2 large cotton swabs. Urethra was prepped with betadine and a 59F catheter was placed and bladder was drained completely. The bladder was then backfilled with sterile water by gravity.  First sensation: 90 First Desire: 210 Strong Desire: 380 Capacity: 450 Cough stress test was positive. Valsalva stress test was negative.  She was was allowed to void on her own.   Interpretation: CMG showed within normal limits sensation, and within normal limits cystometric capacity. Findings positive for stress incontinence, negative for detrusor overactivity.      POP-Q:   POP-Q  -2                                            Aa   -2                                            Ba  -7.5                                              C   4  Gh  3                                            Pb  9                                            tvl   -2                                            Ap  -2                                            Bp  -8                                              D      Rectal Exam:  Normal external exam  Post-Void Residual (PVR) by Bladder Scan: In order to evaluate bladder emptying, we discussed obtaining a postvoid residual and patient agreed to this procedure.  Procedure: The ultrasound unit was placed on the patient's abdomen in the suprapubic region after the patient had voided.    Post Void Residual - 03/01/24 1028       Post Void Residual   Post Void Residual 18 mL           Laboratory Results: Lab Results  Component Value Date   COLORU yellow 03/01/2024   CLARITYU clear 03/01/2024   GLUCOSEUR negative 03/01/2024   BILIRUBINUR negative 03/01/2024   SPECGRAV 1.020 03/01/2024   RBCUR trace-lysed (A) 03/01/2024   PHUR 5.5 03/01/2024   PROTEINUR Negative 06/16/2013   UROBILINOGEN 0.2 03/01/2024   LEUKOCYTESUR Negative 03/01/2024    Lab Results  Component Value Date   CREATININE 0.71 09/06/2023   CREATININE 0.76 01/24/2020    Lab Results  Component Value Date   HGBA1C 6.1 (H) 09/06/2023    Lab Results  Component Value Date   HGB 12.7 09/06/2023     ASSESSMENT AND PLAN Ms. Socarras is a 43 y.o. with:  1. SUI (stress urinary incontinence, female)   2. Urinary frequency    For patient's stress urinary incontinence we discussed options of pessary, pelvic floor physical therapy, urethral bulking, and surgical sling. One of her big concerns is leaking with intercourse, which the pessary would not assist with. She would like to pursue urethral bulking. Simple CMG done today and leakage was demonstrated with cough. IUGA handout on urethral  bulking given.  We discussed the symptoms of overactive bladder (OAB), which include urinary urgency, urinary frequency, nocturia, with or without urge incontinence.  While we do not know the exact etiology of OAB, several treatment options exist. We discussed management including behavioral therapy (decreasing bladder irritants, urge suppression strategies. Encouraged her to decrease her consumption of fake sugars in beverages and to decrease her acidic consumption.   Patient to return for Urethral bulking   Tachina Spoonemore G Gurman [place], NP

## 2024-03-05 DIAGNOSIS — M546 Pain in thoracic spine: Secondary | ICD-10-CM | POA: Diagnosis not present

## 2024-03-05 DIAGNOSIS — M9901 Segmental and somatic dysfunction of cervical region: Secondary | ICD-10-CM | POA: Diagnosis not present

## 2024-03-05 DIAGNOSIS — M9902 Segmental and somatic dysfunction of thoracic region: Secondary | ICD-10-CM | POA: Diagnosis not present

## 2024-03-05 DIAGNOSIS — M5412 Radiculopathy, cervical region: Secondary | ICD-10-CM | POA: Diagnosis not present

## 2024-03-06 DIAGNOSIS — M5412 Radiculopathy, cervical region: Secondary | ICD-10-CM | POA: Diagnosis not present

## 2024-03-06 DIAGNOSIS — M546 Pain in thoracic spine: Secondary | ICD-10-CM | POA: Diagnosis not present

## 2024-03-06 DIAGNOSIS — M9902 Segmental and somatic dysfunction of thoracic region: Secondary | ICD-10-CM | POA: Diagnosis not present

## 2024-03-06 DIAGNOSIS — M9901 Segmental and somatic dysfunction of cervical region: Secondary | ICD-10-CM | POA: Diagnosis not present

## 2024-03-14 DIAGNOSIS — M9902 Segmental and somatic dysfunction of thoracic region: Secondary | ICD-10-CM | POA: Diagnosis not present

## 2024-03-14 DIAGNOSIS — M5412 Radiculopathy, cervical region: Secondary | ICD-10-CM | POA: Diagnosis not present

## 2024-03-14 DIAGNOSIS — M546 Pain in thoracic spine: Secondary | ICD-10-CM | POA: Diagnosis not present

## 2024-03-14 DIAGNOSIS — M9901 Segmental and somatic dysfunction of cervical region: Secondary | ICD-10-CM | POA: Diagnosis not present

## 2024-03-16 DIAGNOSIS — M546 Pain in thoracic spine: Secondary | ICD-10-CM | POA: Diagnosis not present

## 2024-03-16 DIAGNOSIS — M9901 Segmental and somatic dysfunction of cervical region: Secondary | ICD-10-CM | POA: Diagnosis not present

## 2024-03-16 DIAGNOSIS — M5412 Radiculopathy, cervical region: Secondary | ICD-10-CM | POA: Diagnosis not present

## 2024-03-16 DIAGNOSIS — M9902 Segmental and somatic dysfunction of thoracic region: Secondary | ICD-10-CM | POA: Diagnosis not present

## 2024-03-21 ENCOUNTER — Other Ambulatory Visit: Payer: Self-pay

## 2024-03-29 ENCOUNTER — Other Ambulatory Visit: Payer: Self-pay

## 2024-03-29 ENCOUNTER — Telehealth: Admitting: Pediatrics

## 2024-03-29 VITALS — Wt 220.0 lb

## 2024-03-29 DIAGNOSIS — J3089 Other allergic rhinitis: Secondary | ICD-10-CM

## 2024-03-29 DIAGNOSIS — N393 Stress incontinence (female) (male): Secondary | ICD-10-CM | POA: Diagnosis not present

## 2024-03-29 DIAGNOSIS — Z6836 Body mass index (BMI) 36.0-36.9, adult: Secondary | ICD-10-CM | POA: Diagnosis not present

## 2024-03-29 DIAGNOSIS — Z6838 Body mass index (BMI) 38.0-38.9, adult: Secondary | ICD-10-CM

## 2024-03-29 DIAGNOSIS — R928 Other abnormal and inconclusive findings on diagnostic imaging of breast: Secondary | ICD-10-CM | POA: Diagnosis not present

## 2024-03-29 MED ORDER — MONTELUKAST SODIUM 10 MG PO TABS
10.0000 mg | ORAL_TABLET | Freq: Every day | ORAL | 3 refills | Status: AC
  Filled 2024-03-29: qty 90, 90d supply, fill #0
  Filled 2024-07-18: qty 30, 30d supply, fill #1

## 2024-03-29 MED ORDER — PHENTERMINE HCL 15 MG PO CAPS
15.0000 mg | ORAL_CAPSULE | ORAL | 2 refills | Status: DC
  Filled 2024-03-29 – 2024-05-08 (×2): qty 30, 30d supply, fill #0

## 2024-03-29 MED ORDER — TOPIRAMATE 50 MG PO TABS
50.0000 mg | ORAL_TABLET | Freq: Every day | ORAL | 2 refills | Status: DC
  Filled 2024-03-29 – 2024-05-08 (×2): qty 30, 30d supply, fill #0
  Filled 2024-06-12: qty 30, 30d supply, fill #1

## 2024-03-29 NOTE — Progress Notes (Signed)
 Telehealth Visit  I connected with  Mary Conner on 03/31/24 by a video enabled telemedicine application and verified that I am speaking with the correct person using two identifiers.   I discussed the limitations of evaluation and management by telemedicine. The patient expressed understanding and agreed to proceed.  Subjective:    Patient ID: Mary Conner, female    DOB: 09-May-1981, 43 y.o.   MRN: [id number]  HPI: Mary Conner is a 43 y.o. female  Chief Complaint  Patient presents with   Weight Check    Discussed the use of AI scribe software for clinical note transcription with the patient, who gave verbal consent to proceed.  History of Present Illness   Mary Conner is a 43 year old female who presents with dizziness and weight loss management.  She experiences dizziness primarily when bending down, which occurs suddenly but resolves quickly. She experiences dizziness while taking her current weight loss medication regimen, which includes phentermine  and topiramate . She initially stopped the combination medication, suspecting dizziness was due to [employer], but found phentermine  alone ineffective for weight loss. After resuming the combination, she observed consistent weight loss over the past week.  She has been taking both medications together during the day for about a week and has noticed a consistent drop in weight. Despite the dizziness, she feels the medication is effective for weight loss and is manageable. She works in Office manager at [employer], which involves physical activity, and reports that the dizziness does not interfere with her job. She also attends spin classes on her days off, maintaining an active lifestyle.  She experiences dry mouth frequently, which she attributes to the medication. She has previously taken phentermine  alone at a higher dose but did not experience significant weight loss.  In addition to her weight management  concerns, she reports persistent sinus issues despite using a nasal spray for allergies. She experiences sinus pressure and occasional incontinence. She has an upcoming appointment for a breast recheck due to a previous finding that requires follow-up.         Relevant past medical, surgical, family and social history reviewed and updated as indicated. Interim medical history since our last visit reviewed. Allergies and medications reviewed and updated.  ROS per HPI unless specifically indicated above     Objective:    Wt 220 lb (99.8 kg) Comment: Patient obtained  LMP 03/26/2024 (Exact Date)   BMI 36.84 kg/m   Wt Readings from Last 3 Encounters:  03/29/24 220 lb (99.8 kg)  02/08/24 237 lb 3.2 oz (107.6 kg)  01/24/24 229 lb (103.9 kg)     Physical Exam   LIMITED EXAM GIVEN VIDEO VISIT     Assessment & Plan:  Assessment & Plan   BMI 36.0-36.9,adult Assessment & Plan: She is experiencing weight loss with phentermine  and topiramate  and reports manageable dizziness. She is active and satisfied with her current weight loss. Advise taking topiramate  at night to reduce dizziness and continue phentermine  during the day. Schedule a follow-up in two months to reassess weight and medication efficacy. Consider increasing dosage if weight loss plateaus.  Orders: -     Topiramate ; Take 1 tablet (50 mg total) by mouth daily.  Dispense: 30 tablet; Refill: 2 -     Phentermine  HCl; Take 1 capsule (15 mg total) by mouth every morning.  Dispense: 30 capsule; Refill: 2  Non-seasonal allergic rhinitis, unspecified trigger Assessment & Plan: Nasal spray is ineffective. Montelukast  is suggested for sinus  inflammation, and an ENT referral is offered if symptoms persist. Prescribe montelukast  for daily use and continue using Flonase  as needed. Consider ENT referral if symptoms persist.  Orders: -     Montelukast  Sodium; Take 1 tablet (10 mg total) by mouth at bedtime.  Dispense: 30 tablet; Refill:  3  Abnormal mammogram of left breast Assessment & Plan: She is scheduled for a follow-up examination due to a previously noted abnormality. Proceed with the scheduled breast examination on August 14th.    Stress incontinence Assessment & Plan: Working with urogynecology, pending procedure in August.     Follow up plan: Return in about 2 months (around 05/30/2024).  Mary SHAUNNA Nett, MD   This visit was completed via video visit through MyChart due to the restrictions of the COVID-19 pandemic. All issues as above were discussed and addressed. Physical exam was done as above through visual confirmation on video through MyChart. If it was felt that the patient should be evaluated in the office, they were directed there. The patient verbally consented to this visit. Location of the patient: home Location of the provider: work Those involved with this call:  Provider: Hadassah Nett, MD CMA: Cena Maffucci, CMA Time spent on call: 15 minutes with patient face to face via video conference. More than 50% of this time was spent in counseling and coordination of care. 15 minutes total spent in review of patient's record and preparation of their chart. Total time spent on this encounter: 30 minutes.

## 2024-03-31 ENCOUNTER — Encounter: Payer: Self-pay | Admitting: Pediatrics

## 2024-03-31 DIAGNOSIS — R928 Other abnormal and inconclusive findings on diagnostic imaging of breast: Secondary | ICD-10-CM | POA: Insufficient documentation

## 2024-03-31 NOTE — Assessment & Plan Note (Signed)
 She is experiencing weight loss with phentermine  and topiramate  and reports manageable dizziness. She is active and satisfied with her current weight loss. Advise taking topiramate  at night to reduce dizziness and continue phentermine  during the day. Schedule a follow-up in two months to reassess weight and medication efficacy. Consider increasing dosage if weight loss plateaus.

## 2024-03-31 NOTE — Assessment & Plan Note (Signed)
 Working with urogynecology, pending procedure in August.

## 2024-03-31 NOTE — Assessment & Plan Note (Signed)
 Nasal spray is ineffective. Montelukast  is suggested for sinus inflammation, and an ENT referral is offered if symptoms persist. Prescribe montelukast  for daily use and continue using Flonase  as needed. Consider ENT referral if symptoms persist.

## 2024-03-31 NOTE — Assessment & Plan Note (Signed)
 She is scheduled for a follow-up examination due to a previously noted abnormality. Proceed with the scheduled breast examination on August 14th.

## 2024-04-02 ENCOUNTER — Ambulatory Visit
Admission: RE | Admit: 2024-04-02 | Discharge: 2024-04-02 | Disposition: A | Discharge status: Home / Self Care | Source: Ambulatory Visit | Attending: Pediatrics | Admitting: Pediatrics

## 2024-04-02 DIAGNOSIS — R92333 Mammographic heterogeneous density, bilateral breasts: Secondary | ICD-10-CM | POA: Diagnosis not present

## 2024-04-02 DIAGNOSIS — N6489 Other specified disorders of breast: Secondary | ICD-10-CM | POA: Insufficient documentation

## 2024-04-02 DIAGNOSIS — R928 Other abnormal and inconclusive findings on diagnostic imaging of breast: Secondary | ICD-10-CM | POA: Diagnosis not present

## 2024-04-27 DIAGNOSIS — Z6836 Body mass index (BMI) 36.0-36.9, adult: Secondary | ICD-10-CM

## 2024-05-08 ENCOUNTER — Other Ambulatory Visit: Payer: Self-pay

## 2024-05-09 ENCOUNTER — Other Ambulatory Visit: Payer: Self-pay

## 2024-05-09 MED ORDER — PHENTERMINE HCL 30 MG PO CAPS
30.0000 mg | ORAL_CAPSULE | ORAL | 2 refills | Status: DC
  Filled 2024-05-09: qty 30, 30d supply, fill #0
  Filled 2024-06-12: qty 30, 30d supply, fill #1
  Filled 2024-07-18: qty 30, 30d supply, fill #2

## 2024-05-16 ENCOUNTER — Other Ambulatory Visit: Payer: Self-pay

## 2024-05-16 ENCOUNTER — Telehealth: Payer: Self-pay

## 2024-05-16 MED ORDER — CIPROFLOXACIN HCL 500 MG PO TABS
ORAL_TABLET | ORAL | 0 refills | Status: DC
Start: 2024-05-16 — End: 2024-05-17
  Filled 2024-05-16: qty 2, 1d supply, fill #0

## 2024-05-16 NOTE — Telephone Encounter (Signed)
 Left message to return call for possible time change and pre-procedure instructions

## 2024-05-17 ENCOUNTER — Encounter: Payer: Self-pay | Admitting: Obstetrics

## 2024-05-17 ENCOUNTER — Other Ambulatory Visit: Payer: Self-pay

## 2024-05-17 ENCOUNTER — Ambulatory Visit: Payer: Self-pay | Admitting: Obstetrics

## 2024-05-17 ENCOUNTER — Ambulatory Visit (INDEPENDENT_AMBULATORY_CARE_PROVIDER_SITE_OTHER): Admitting: Obstetrics

## 2024-05-17 VITALS — BP 116/84 | HR 90

## 2024-05-17 DIAGNOSIS — N393 Stress incontinence (female) (male): Secondary | ICD-10-CM

## 2024-05-17 LAB — POCT URINALYSIS DIP (CLINITEK)
Glucose, UA: NEGATIVE mg/dL
Ketones, POC UA: NEGATIVE mg/dL
Leukocytes, UA: NEGATIVE
Nitrite, UA: NEGATIVE
POC PROTEIN,UA: 30 — AB
Spec Grav, UA: >=1.03 — AB (ref 1.010–1.025)
Urobilinogen, UA: 0.2 U/dL
pH, UA: 5.5 (ref 5.0–8.0)

## 2024-05-17 MED ORDER — LIDOCAINE-EPINEPHRINE 1 %-1:100000 IJ SOLN
8.0000 mL | Freq: Once | INTRAMUSCULAR | Status: AC
  Administered 2024-05-17: 8 mL

## 2024-05-17 MED ORDER — CIPROFLOXACIN HCL 500 MG PO TABS
500.0000 mg | ORAL_TABLET | Freq: Once | ORAL | Status: AC
  Administered 2024-05-17: 500 mg via ORAL

## 2024-05-17 NOTE — Addendum Note (Signed)
 Addended by: KRYSTAL ANDREE GAILS on: 05/17/2024 02:31 PM   Modules accepted: Orders

## 2024-05-17 NOTE — Patient Instructions (Signed)

## 2024-05-17 NOTE — Progress Notes (Signed)
 Bulkamid Injection  CC: 43 y.o. y.o. F with stress incontinence who presents for transurethral Bulkamid injection.  Patient signed her consent form.  She started antibiotic prophylaxis with Ciprofloxacin  today. Currently on her cycle  Today's Vitals   05/17/24 1103  BP: 116/84  Pulse: 90    Results for orders placed or performed in visit on 05/17/24 (from the past 24 hours)  POCT URINALYSIS DIP (CLINITEK)     Status: Abnormal   Collection Time: 05/17/24 12:01 PM  Result Value Ref Range   Color, UA yellow yellow   Clarity, UA clear clear   Glucose, UA negative negative mg/dL   Bilirubin, UA small (A) negative   Ketones, POC UA negative negative mg/dL   Spec Grav, UA >=8.969 (A) 1.010 - 1.025   Blood, UA small (A) negative   pH, UA 5.5 5.0 - 8.0   POC PROTEIN,UA =30 (A) negative, trace   Urobilinogen, UA 0.2 0.2 or 1.0 E.U./dL   Nitrite, UA Negative Negative   Leukocytes, UA Negative Negative    Procedure: Time out was performed. The bladder was catheterized and 10 ml of 2% lidocaine  jelly placed in the urethra. A urethral block was performed by injecting 3ml of 1% lidocaine  with epinephrine  at 3 and 9 o'clock adjacent to the urethra.  The needle was primed.  The cystoscope was inserted to the level of the bladder neck.  The needle was inserted 2 cm and the scope was pulled back into the urethra 2 cm.  The needle was inserted bevel up at the 5 o'clock position and the Bulkamid was injected to obtain coaptation.  This was repeated at the 2 o'clock,  10 o'clock and 7 o'clock positions.   A total of 2- 1ml syringes were used and good circumferential coaptation was noted.  The patient tolerated the procedure well. She was asked to void after the procedure.  Post-Void Residual (PVR) by Bladder Scan: In order to evaluate bladder emptying, we discussed obtaining a postvoid residual and she agreed to this procedure.  Procedure: The ultrasound unit was placed on the patient's abdomen in the  suprapubic region after the patient had voided.     Post Void Residual - 05/17/24 1146       Post Void Residual   Post Void Residual 6 mL           ASSESSMENT: 43 y.o. y.o. s/p transurethral Bulkamid injection for stress incontinence.   PLAN: - Patient will follow up in 4 weeks to reassess. Voiding and post-procedure precautions were given. She will return for heavy bleeding, fevers, dysuria lasting beyond today and incomplete emptying. - repeat UA at follow-up, abnormal UA possible due to menses. Denies UTI symptoms.  All questions were answered.  Lianne ONEIDA Gillis, MD

## 2024-05-18 NOTE — Telephone Encounter (Signed)
 Patient came to procedure appointment

## 2024-06-07 ENCOUNTER — Encounter: Payer: Self-pay | Admitting: Pediatrics

## 2024-06-07 ENCOUNTER — Ambulatory Visit (INDEPENDENT_AMBULATORY_CARE_PROVIDER_SITE_OTHER): Admitting: Pediatrics

## 2024-06-07 ENCOUNTER — Other Ambulatory Visit: Payer: Self-pay

## 2024-06-07 VITALS — BP 123/84 | HR 111 | Ht 64.0 in | Wt 217.2 lb

## 2024-06-07 DIAGNOSIS — B351 Tinea unguium: Secondary | ICD-10-CM | POA: Diagnosis not present

## 2024-06-07 DIAGNOSIS — Z6837 Body mass index (BMI) 37.0-37.9, adult: Secondary | ICD-10-CM | POA: Diagnosis not present

## 2024-06-07 DIAGNOSIS — R000 Tachycardia, unspecified: Secondary | ICD-10-CM

## 2024-06-07 MED ORDER — TERBINAFINE HCL 250 MG PO TABS
250.0000 mg | ORAL_TABLET | Freq: Every day | ORAL | 0 refills | Status: DC
  Filled 2024-06-07: qty 90, 90d supply, fill #0

## 2024-06-07 NOTE — Progress Notes (Signed)
 Office Visit  BP 123/84 (BP Location: Right Arm, Patient Position: Sitting, Cuff Size: Large)   Pulse (!) 111   Ht 5' 4 (1.626 m)   Wt 217 lb 3.2 oz (98.5 kg)   SpO2 98%   BMI 37.28 kg/m    Subjective:    Patient ID: Mary Conner, female    DOB: 1980/10/28, 43 y.o.   MRN: [id number]  HPI: Mary Conner is a 43 y.o. female  Chief Complaint  Patient presents with   Weight Management Screening    Discussed the use of AI scribe software for clinical note transcription with the patient, who gave verbal consent to proceed.  History of Present Illness   Mary Conner is a 43 year old female who presents with concerns about weight gain and elevated heart rate.  She has experienced a weight gain of approximately 20 pounds since May. She is currently taking phentermine  30 mg in the morning, which she reports is working for her, but notes dizziness if taken at night.  She attributes her elevated heart rate to rushing to the appointment and recent physical activity, including a spin class and pre-workout supplement intake. She experiences occasional anxiety and shortness of breath, particularly when preparing for activities, but denies having anxiety as a chronic condition. Her boyfriend suggested she might have high blood pressure, as noted during a dental visit, but she has not been diagnosed with hypertension.  She is concerned about a fungal infection affecting mainly one toenail, possibly the pinky nail as well, and is interested in treatment options.  She mentions feeling burned out at work, dealing with mental health patients, and experiencing micromanagement from her boss, which contributes to her stress.        Relevant past medical, surgical, family and social history reviewed and updated as indicated. Interim medical history since our last visit reviewed. Allergies and medications reviewed and updated.  ROS per HPI unless specifically indicated  above     Objective:    BP 123/84 (BP Location: Right Arm, Patient Position: Sitting, Cuff Size: Large)   Pulse (!) 111   Ht 5' 4 (1.626 m)   Wt 217 lb 3.2 oz (98.5 kg)   SpO2 98%   BMI 37.28 kg/m   Wt Readings from Last 3 Encounters:  06/07/24 217 lb 3.2 oz (98.5 kg)  03/29/24 220 lb (99.8 kg)  02/08/24 237 lb 3.2 oz (107.6 kg)     Physical Exam Constitutional:      Appearance: Normal appearance.  Cardiovascular:     Rate and Rhythm: Tachycardia present.  Pulmonary:     Effort: Pulmonary effort is normal.  Musculoskeletal:        General: Normal range of motion.  Skin:    Comments: Normal skin color  Neurological:     General: No focal deficit present.     Mental Status: She is alert. Mental status is at baseline.  Psychiatric:        Mood and Affect: Mood normal.        Behavior: Behavior normal.        Thought Content: Thought content normal.         06/07/2024   11:05 AM 03/29/2024    4:18 PM 02/08/2024    8:14 AM 01/24/2024    8:10 AM 09/06/2023   11:14 AM  Depression screen PHQ 2/9  Decreased Interest 0 0 0 0 0  Down, Depressed, Hopeless 0 0 0 0 0  PHQ -  2 Score 0 0 0 0 0  Altered sleeping 0 0 0 0 0  Tired, decreased energy 0 0 0 0 0  Change in appetite 0 0 0 1 0  Feeling bad or failure about yourself  0 0 0 0 0  Trouble concentrating 0 0 0 0 0  Moving slowly or fidgety/restless 0 0 0 0 0  Suicidal thoughts 0 0 0 0 0  PHQ-9 Score 0 0 0 1 0  Difficult doing work/chores   Not difficult at all Not difficult at all Not difficult at all       06/07/2024   11:05 AM 03/29/2024    4:18 PM 02/08/2024    8:15 AM 01/24/2024    8:12 AM  GAD 7 : Generalized Anxiety Score  Nervous, Anxious, on Edge 0 0 0 0  Control/stop worrying 0 0 0 0  Worry too much - different things 0 0 0 0  Trouble relaxing 0 0 0 0  Restless 0 0 0 0  Easily annoyed or irritable 0 0 0 0  Afraid - awful might happen 0 0 0 0  Total GAD 7 Score 0 0 0 0  Anxiety Difficulty  Not difficult at  all Not difficult at all Not difficult at all       Assessment & Plan:  Assessment & Plan   BMI 37.0-37.9, adult Assessment & Plan: Weight gain of 20 pounds since May. Phentermine  30 mg effective but may be causing tachycardia. Discussed cardiovascular risks of long-term elevated heart rate. Of note, she is using pre-workout and came here from working out so unclear if that is more so contributing. Plan to check later today.  - Monitor heart rate at home, especially post-exercise and pre-workout. - Consider reducing phentermine  to 15 mg if heart rate remains elevated. - Increase topiramate  if phentermine  is reduced to maintain efficacy. - Reassess in 3 months for weight management and side effects. - Educate on home pulse monitoring. - Reduce pre-workout supplements if heart rate remains elevated.   Tachycardia Elevated heart rate likely due to phentermine , exercise, and supplements. Discussed monitoring and potential medication adjustment if persistent. - Contact provider if heart rate persists for further evaluation and medication adjustment.  Fungal nail infection Affects mainly one toenail, possibly pinky nail. Oral antifungal preferred for efficacy. Liver function assessment to establish baseline LFTs. - Order liver enzyme tests before starting oral antifungal. - Initiate 12-week oral antifungal treatment pending normal liver enzyme results.  -     Comprehensive metabolic panel with GFR -     Terbinafine  HCl; Take 1 tablet (250 mg total) by mouth daily. Take daily for a total of 12 weeks.  Dispense: 90 tablet; Refill: 0   Follow up plan: Return in about 3 months (around 09/06/2024) for wt management.  Hadassah SHAUNNA Nett, MD

## 2024-06-07 NOTE — Assessment & Plan Note (Signed)
 Weight gain of 20 pounds since May. Phentermine  30 mg effective but may be causing tachycardia. Discussed cardiovascular risks of long-term elevated heart rate. Of note, she is using pre-workout and came here from working out so unclear if that is more so contributing. Plan to check later today.  - Monitor heart rate at home, especially post-exercise and pre-workout. - Consider reducing phentermine  to 15 mg if heart rate remains elevated. - Increase topiramate  if phentermine  is reduced to maintain efficacy. - Reassess in 3 months for weight management and side effects. - Educate on home pulse monitoring. - Reduce pre-workout supplements if heart rate remains elevated.

## 2024-06-07 NOTE — Patient Instructions (Signed)
 Measure your heart rate at home and message me  If still elevated, it is probably the phentermine  If not elevated, probably the pre-workout  If it's the phentermine , we can reduce the dose and increase topiramate 

## 2024-06-08 ENCOUNTER — Ambulatory Visit: Payer: Self-pay | Admitting: Pediatrics

## 2024-06-08 LAB — COMPREHENSIVE METABOLIC PANEL WITH GFR
ALT: 15 IU/L (ref 0–32)
AST: 18 IU/L (ref 0–40)
Albumin: 4.5 g/dL (ref 3.9–4.9)
Alkaline Phosphatase: 74 IU/L (ref 41–116)
BUN/Creatinine Ratio: 15 (ref 9–23)
BUN: 16 mg/dL (ref 6–24)
Bilirubin Total: 0.3 mg/dL (ref 0.0–1.2)
CO2: 17 mmol/L — ABNORMAL LOW (ref 20–29)
Calcium: 10.2 mg/dL (ref 8.7–10.2)
Chloride: 103 mmol/L (ref 96–106)
Creatinine, Ser: 1.09 mg/dL — ABNORMAL HIGH (ref 0.57–1.00)
Globulin, Total: 2.9 g/dL (ref 1.5–4.5)
Glucose: 84 mg/dL (ref 70–99)
Potassium: 4.1 mmol/L (ref 3.5–5.2)
Sodium: 138 mmol/L (ref 134–144)
Total Protein: 7.4 g/dL (ref 6.0–8.5)
eGFR: 64 mL/min/1.73 (ref >59)

## 2024-06-12 ENCOUNTER — Other Ambulatory Visit: Payer: Self-pay | Admitting: Pediatrics

## 2024-06-12 ENCOUNTER — Other Ambulatory Visit: Payer: Self-pay

## 2024-06-12 DIAGNOSIS — R7989 Other specified abnormal findings of blood chemistry: Secondary | ICD-10-CM

## 2024-06-12 NOTE — Progress Notes (Signed)
 Future bmp order placed  Mary SHAUNNA Nett, MD

## 2024-06-12 NOTE — Progress Notes (Signed)
 Scheduled

## 2024-06-12 NOTE — Progress Notes (Unsigned)
 [facility] Return Visit  SUBJECTIVE  History of Present Illness: KELISE KUCH is a 43 y.o. female seen in follow-up for urethral bulking on 05/17/24. Plan at last visit was ***.   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   Past Surgical History: She  has a past surgical history that includes Cholecystectomy; Appendectomy; and Cyst excision.   Medications: She has a current medication list which includes the following prescription(s): chlorhexidine , fluticasone , montelukast , phentermine , terbinafine , and topiramate .   Allergies: Patient has no known allergies.   Social History: Patient  reports that she has never smoked. She has never used smokeless tobacco. She reports current alcohol use. She reports that she does not use drugs.     OBJECTIVE     Physical Exam: There were no vitals filed for this visit. Gen: No apparent distress, A&O x 3.  Detailed Urogynecologic Evaluation:  Deferred. Prior exam showed:      No data to display             ASSESSMENT AND PLAN    Ms. Jeppsen is a 43 y.o. with:  No diagnosis found.  There are no diagnoses linked to this encounter.   Lianne ONEIDA Gillis, MD

## 2024-06-13 ENCOUNTER — Ambulatory Visit (INDEPENDENT_AMBULATORY_CARE_PROVIDER_SITE_OTHER): Admitting: Obstetrics

## 2024-06-13 ENCOUNTER — Other Ambulatory Visit (HOSPITAL_COMMUNITY)
Admission: RE | Admit: 2024-06-13 | Discharge: 2024-06-13 | Disposition: A | Discharge status: Home / Self Care | Source: Ambulatory Visit | Attending: Obstetrics | Admitting: Obstetrics

## 2024-06-13 ENCOUNTER — Ambulatory Visit: Admitting: Obstetrics

## 2024-06-13 ENCOUNTER — Encounter: Payer: Self-pay | Admitting: Obstetrics

## 2024-06-13 VITALS — BP 136/86 | HR 76

## 2024-06-13 DIAGNOSIS — R319 Hematuria, unspecified: Secondary | ICD-10-CM | POA: Diagnosis not present

## 2024-06-13 DIAGNOSIS — R8290 Unspecified abnormal findings in urine: Secondary | ICD-10-CM | POA: Insufficient documentation

## 2024-06-13 DIAGNOSIS — N393 Stress incontinence (female) (male): Secondary | ICD-10-CM

## 2024-06-13 LAB — URINALYSIS, ROUTINE W REFLEX MICROSCOPIC
Bilirubin Urine: NEGATIVE
Glucose, UA: NEGATIVE mg/dL
Hgb urine dipstick: NEGATIVE
Ketones, ur: NEGATIVE mg/dL
Leukocytes,Ua: NEGATIVE
Nitrite: NEGATIVE
Protein, ur: NEGATIVE mg/dL
Specific Gravity, Urine: 1.014 (ref 1.005–1.030)
pH: 6 (ref 5.0–8.0)

## 2024-06-13 LAB — POCT URINALYSIS DIP (CLINITEK)
Bilirubin, UA: NEGATIVE
Glucose, UA: NEGATIVE mg/dL
Ketones, POC UA: NEGATIVE mg/dL
Leukocytes, UA: NEGATIVE
Nitrite, UA: NEGATIVE
POC PROTEIN,UA: NEGATIVE
Spec Grav, UA: 1.02 (ref 1.010–1.025)
Urobilinogen, UA: 0.2 U/dL
pH, UA: 7 (ref 5.0–8.0)

## 2024-06-13 NOTE — Assessment & Plan Note (Signed)
-   POCT UA + heme - pending UA micro and culture

## 2024-06-13 NOTE — Assessment & Plan Note (Addendum)
-   significant improvement after urethral bulking 05/17/24, PVR 15mL - SUI 1x/week down from 6x/day, considering repeat urethral bulking  - For treatment of stress urinary incontinence,  non-surgical options include expectant management, weight loss, physical therapy, as well as a pessary.  Surgical options include a midurethral sling, Burch urethropexy, and transurethral injection of a bulking agent. - encouraged weight reduction and Kegel exercises - urethral bulking (Bulkamid). We discussed success rate of approximately 70-80% and possible need for third injection. We reviewed that this is not a permanent procedure and the Bulkamid does become less effective over time. Risks reviewed including injury to bladder or urethra, UTI, urinary retention and hematuria.  - pt desires to proceed, office to schedule repeat urethral bulking

## 2024-06-13 NOTE — Patient Instructions (Signed)
 Please call if you experience any change in urinary or vaginal symptoms.  My office will call to schedule repeat urethral bulking injection.

## 2024-06-14 ENCOUNTER — Ambulatory Visit: Payer: Self-pay | Admitting: Obstetrics

## 2024-06-14 LAB — URINE CULTURE: Culture: <10000 — AB

## 2024-06-14 NOTE — Telephone Encounter (Signed)
 Patient has been contacted and she was informed of her negative results. Patient has been advised to contact office if worsening symptoms occur.

## 2024-06-26 ENCOUNTER — Other Ambulatory Visit

## 2024-06-26 DIAGNOSIS — R7989 Other specified abnormal findings of blood chemistry: Secondary | ICD-10-CM

## 2024-06-27 ENCOUNTER — Ambulatory Visit: Payer: Self-pay | Admitting: Pediatrics

## 2024-06-27 LAB — BASIC METABOLIC PANEL WITH GFR
BUN/Creatinine Ratio: 16 (ref 9–23)
BUN: 15 mg/dL (ref 6–24)
CO2: 18 mmol/L — ABNORMAL LOW (ref 20–29)
Calcium: 8.9 mg/dL (ref 8.7–10.2)
Chloride: 105 mmol/L (ref 96–106)
Creatinine, Ser: 0.92 mg/dL (ref 0.57–1.00)
Glucose: 100 mg/dL — ABNORMAL HIGH (ref 70–99)
Potassium: 3.9 mmol/L (ref 3.5–5.2)
Sodium: 138 mmol/L (ref 134–144)
eGFR: 79 mL/min/1.73 (ref >59)

## 2024-07-18 ENCOUNTER — Other Ambulatory Visit: Payer: Self-pay

## 2024-07-18 ENCOUNTER — Other Ambulatory Visit: Payer: Self-pay | Admitting: Pediatrics

## 2024-07-18 DIAGNOSIS — B351 Tinea unguium: Secondary | ICD-10-CM

## 2024-07-19 ENCOUNTER — Other Ambulatory Visit: Payer: Self-pay | Admitting: Pediatrics

## 2024-07-19 ENCOUNTER — Other Ambulatory Visit: Payer: Self-pay

## 2024-07-19 DIAGNOSIS — B351 Tinea unguium: Secondary | ICD-10-CM

## 2024-07-20 ENCOUNTER — Other Ambulatory Visit: Payer: Self-pay

## 2024-07-21 NOTE — Telephone Encounter (Signed)
 Requested medication (s) are due for refill today: yes  Requested medication (s) are on the active medication list: yes  Last refill:  06/07/24  Future visit scheduled: yes  Notes to clinic:   Medication not assigned to a protocol, review manually.     Requested Prescriptions  Pending Prescriptions Disp Refills   terbinafine  (LAMISIL ) 250 MG tablet 90 tablet 0    Sig: Take 1 tablet (250 mg total) by mouth daily. Take daily for a total of 12 weeks.     Off-Protocol Failed - 07/21/2024  8:18 AM      Failed - Medication not assigned to a protocol, review manually.      Passed - Valid encounter within last 12 months    Recent Outpatient Visits           1 month ago BMI 37.0-37.9, adult   [place] [facility] Mary Hadassah SQUIBB, MD   3 months ago BMI 36.0-36.9,adult   [place] [facility] Mary Hadassah SQUIBB, MD   5 months ago BMI 38.0-38.9,adult   [place] [facility] Mary Hadassah SQUIBB, MD   5 months ago BMI 38.0-38.9,adult   [place] [facility] Mary Hadassah SQUIBB, MD   8 months ago Prediabetes   [place] [facility] Mary Hadassah SQUIBB, MD       Future Appointments             In 2 months Mary Lianne DASEN, MD [facility] at [facility], [place]

## 2024-07-23 ENCOUNTER — Other Ambulatory Visit: Payer: Self-pay

## 2024-07-23 ENCOUNTER — Other Ambulatory Visit: Payer: Self-pay | Admitting: Pediatrics

## 2024-07-23 DIAGNOSIS — B351 Tinea unguium: Secondary | ICD-10-CM

## 2024-07-24 NOTE — Telephone Encounter (Signed)
 Requested medications are due for refill today.  yes  Requested medications are on the active medications list.  yes  Last refill. 06/07/2024 #90 0 rf  Future visit scheduled.   yes  Notes to clinic.  Medication not assigned to a protocol. Please review for refill.    Requested Prescriptions  Pending Prescriptions Disp Refills   terbinafine  (LAMISIL ) 250 MG tablet 90 tablet 0    Sig: Take 1 tablet (250 mg total) by mouth daily. Take daily for a total of 12 weeks.     Off-Protocol Failed - 07/24/2024  5:58 PM      Failed - Medication not assigned to a protocol, review manually.      Passed - Valid encounter within last 12 months    Recent Outpatient Visits           1 month ago BMI 37.0-37.9, adult   [place] [facility]old Hadassah SQUIBB, MD   3 months ago BMI 36.0-36.9,adult   [place] [facility] Herold Hadassah SQUIBB, MD   5 months ago BMI 38.0-38.9,adult   [place] [facility] Herold Hadassah SQUIBB, MD   6 months ago BMI 38.0-38.9,adult   [place] [facility] Hadassah SQUIBB, MD   8 months ago Prediabetes   [place] [facility]old Hadassah SQUIBB, MD       Future Appointments             In 2 months Gu[place] Lianne DASEN, MD [facility] at [facility], [facility]

## 2024-07-25 ENCOUNTER — Other Ambulatory Visit: Payer: Self-pay | Admitting: Pediatrics

## 2024-07-25 ENCOUNTER — Other Ambulatory Visit: Payer: Self-pay

## 2024-07-25 DIAGNOSIS — B351 Tinea unguium: Secondary | ICD-10-CM

## 2024-07-25 NOTE — Telephone Encounter (Signed)
 Requested medication (s) are due for refill today: yes  Requested medication (s) are on the active medication list: yes  Last refill:  06/07/24  Future visit scheduled: yes  Notes to clinic:   Medication not assigned to a protocol, review manually.      Requested Prescriptions  Pending Prescriptions Disp Refills   terbinafine  (LAMISIL ) 250 MG tablet 90 tablet 0    Sig: Take 1 tablet (250 mg total) by mouth daily. Take daily for a total of 12 weeks.     Off-Protocol Failed - 07/25/2024  1:43 PM      Failed - Medication not assigned to a protocol, review manually.      Passed - Valid encounter within last 12 months    Recent Outpatient Visits           1 month ago BMI 37.0-37.9, adult   [place] [facility] Hadassah SQUIBB, MD   3 months ago BMI 36.0-36.9,adult   [place] [facility] Herold Hadassah SQUIBB, MD   5 months ago BMI 38.0-38.9,adult   [place] [facility] Hadassah SQUIBB, MD   6 months ago BMI 38.0-38.9,adult   [place] [facility] Herold Hadassah SQUIBB, MD   8 months ago Prediabetes   [place] [facility] Herold Hadassah SQUIBB, MD       Future Appointments             In 2 months [place] Lianne DASEN, MD [facility] at [facility], [facility]

## 2024-07-25 NOTE — Telephone Encounter (Signed)
 Patient will wait until the 11/13 appt.

## 2024-07-26 ENCOUNTER — Other Ambulatory Visit: Payer: Self-pay

## 2024-08-02 ENCOUNTER — Encounter: Payer: Self-pay | Admitting: Pediatrics

## 2024-08-02 ENCOUNTER — Ambulatory Visit (INDEPENDENT_AMBULATORY_CARE_PROVIDER_SITE_OTHER): Admitting: Pediatrics

## 2024-08-02 ENCOUNTER — Other Ambulatory Visit: Payer: Self-pay

## 2024-08-02 VITALS — BP 132/85 | HR 78 | Temp 97.9°F | Resp 15 | Ht 64.02 in | Wt 224.4 lb

## 2024-08-02 DIAGNOSIS — B351 Tinea unguium: Secondary | ICD-10-CM

## 2024-08-02 DIAGNOSIS — Z6836 Body mass index (BMI) 36.0-36.9, adult: Secondary | ICD-10-CM

## 2024-08-02 DIAGNOSIS — Z6837 Body mass index (BMI) 37.0-37.9, adult: Secondary | ICD-10-CM | POA: Diagnosis not present

## 2024-08-02 MED ORDER — TOPIRAMATE 50 MG PO TABS
50.0000 mg | ORAL_TABLET | Freq: Every day | ORAL | 2 refills | Status: AC
  Filled 2024-08-02: qty 30, 30d supply, fill #0
  Filled 2024-09-16: qty 30, 30d supply, fill #1

## 2024-08-02 MED ORDER — PHENTERMINE HCL 37.5 MG PO CAPS
37.5000 mg | ORAL_CAPSULE | ORAL | 2 refills | Status: AC
  Filled 2024-08-02: qty 30, 30d supply, fill #0
  Filled 2024-09-16: qty 30, 30d supply, fill #1

## 2024-08-02 NOTE — Progress Notes (Signed)
 Office Visit  BP 132/85 (BP Location: Right Arm, Patient Position: Sitting, Cuff Size: Large)   Pulse 78   Temp 97.9 F (36.6 C) (Oral)   Resp 15   Ht 5' 4.02 (1.626 m)   Wt 224 lb 6.4 oz (101.8 kg)   SpO2 97%   BMI 38.50 kg/m    Subjective:    Patient ID: Mary Conner, female    DOB: May 22, 1981, 43 y.o.   MRN: [id number]  HPI: Mary Conner is a 43 y.o. female  Chief Complaint  Patient presents with   Weight medication    Says she was told needed appointment per pharmacy.     Discussed the use of AI scribe software for clinical note transcription with the patient, who gave verbal consent to proceed.  History of Present Illness   Mary Conner is a 43 year old female who presents for medication management and follow-up for weight management.  She is currently taking terbinafine  for onychomycosis. She is unsure about the remaining duration of her treatment. There was a mix-up with her prescription refill, leading to multiple calls from the pharmacy.  She is on Topamax  as part of her weight management regimen. She has not experienced significant weight loss and has not had any side effects such as nausea or dizziness. She mentioned experiencing a back spasm, which she attributes to her rowing class rather than the medication.  She is taking phentermine  at a 30 mg dose. She has experienced increased heart rate during exercise, which she attributes to a pre-workout supplement rather than the medication. She experiences chest pain and nausea when using the pre-workout supplement and has decided to avoid it.  She is actively engaged in physical activities, including rowing classes, and monitors her heart rate during exercise. Her heart rate can reach up to 170 bpm during workouts, which she initially found concerning. No nausea, dizziness, or significant side effects from her current medications.      Relevant past medical, surgical, family and social  history reviewed and updated as indicated. Interim medical history since our last visit reviewed. Allergies and medications reviewed and updated.  ROS per HPI unless specifically indicated above     Objective:    BP 132/85 (BP Location: Right Arm, Patient Position: Sitting, Cuff Size: Large)   Pulse 78   Temp 97.9 F (36.6 C) (Oral)   Resp 15   Ht 5' 4.02 (1.626 m)   Wt 224 lb 6.4 oz (101.8 kg)   SpO2 97%   BMI 38.50 kg/m   Wt Readings from Last 3 Encounters:  08/02/24 224 lb 6.4 oz (101.8 kg)  06/07/24 217 lb 3.2 oz (98.5 kg)  03/29/24 220 lb (99.8 kg)     Physical Exam Constitutional:      Appearance: Normal appearance.  Pulmonary:     Effort: Pulmonary effort is normal.  Musculoskeletal:        General: Normal range of motion.  Skin:    Comments: Normal skin color  Neurological:     General: No focal deficit present.     Mental Status: She is alert. Mental status is at baseline.  Psychiatric:        Mood and Affect: Mood normal.        Behavior: Behavior normal.        Thought Content: Thought content normal.         08/02/2024    3:34 PM 06/07/2024   11:05 AM 03/29/2024  4:18 PM 02/08/2024    8:14 AM 01/24/2024    8:10 AM  Depression screen PHQ 2/9  Decreased Interest  0 0 0 0  Down, Depressed, Hopeless 0 0 0 0 0  PHQ - 2 Score 0 0 0 0 0  Altered sleeping 0 0 0 0 0  Tired, decreased energy 0 0 0 0 0  Change in appetite 0 0 0 0 1  Feeling bad or failure about yourself  0 0 0 0 0  Trouble concentrating 0 0 0 0 0  Moving slowly or fidgety/restless 0 0 0 0 0  Suicidal thoughts 0 0 0 0 0  PHQ-9 Score 0 0  0  0  1   Difficult doing work/chores    Not difficult at all Not difficult at all     Data saved with a previous flowsheet row definition       08/02/2024    3:35 PM 06/07/2024   11:05 AM 03/29/2024    4:18 PM 02/08/2024    8:15 AM  GAD 7 : Generalized Anxiety Score  Nervous, Anxious, on Edge 0 0 0 0  Control/stop worrying 0 0 0 0  Worry too  much - different things 0 0 0 0  Trouble relaxing 0 0 0 0  Restless 0 0 0 0  Easily annoyed or irritable 0 0 0 0  Afraid - awful might happen 0 0 0 0  Total GAD 7 Score 0 0 0 0  Anxiety Difficulty Not difficult at all  Not difficult at all Not difficult at all       Assessment & Plan:  Assessment & Plan   BMI 37.0-37.9, adult Obesity managed with phentermine  and topiramate . No significant weight loss. No side effects from topiramate . Discussed increasing phentermine  to enhance weight loss. Discussed future options including injectable medications like Wegovy , not covered by insurance. Considered alternative programs and compounding pharmacies for cost-effective options. - Increased phentermine  to 37.5 mg. - Continue topiramate  at current dose. - Discussed potential future use of injectable weight loss medications like Wegovy . - Consider alternative weight management programs and compounding pharmacies for cost-effective options. -     Phentermine  HCl; Take 1 capsule (37.5 mg total) by mouth every morning.  Dispense: 30 capsule; Refill: 2 -     Topiramate ; Take 1 tablet (50 mg total) by mouth daily.  Dispense: 30 tablet; Refill: 2  Onychomycosis Onychomycosis under terbinafine  treatment. Improvement noted, not fully resolved. Discussed potential second course if symptoms persist after 6 months. - Continue terbinafine  for 4 more weeks. - Will consider a second course of treatment if symptoms persist after 6 months.     Follow up plan: Return in about 3 months (around 11/02/2024).  Hadassah SHAUNNA Nett, MD

## 2024-08-06 ENCOUNTER — Telehealth: Admitting: Physician Assistant

## 2024-08-06 ENCOUNTER — Other Ambulatory Visit: Payer: Self-pay

## 2024-08-06 DIAGNOSIS — M545 Low back pain, unspecified: Secondary | ICD-10-CM | POA: Diagnosis not present

## 2024-08-06 MED ORDER — CYCLOBENZAPRINE HCL 10 MG PO TABS
5.0000 mg | ORAL_TABLET | Freq: Three times a day (TID) | ORAL | 0 refills | Status: AC | PRN
  Filled 2024-08-06: qty 30, 10d supply, fill #0

## 2024-08-06 MED ORDER — NAPROXEN 500 MG PO TABS
500.0000 mg | ORAL_TABLET | Freq: Two times a day (BID) | ORAL | 0 refills | Status: AC
  Filled 2024-08-06: qty 30, 15d supply, fill #0

## 2024-08-06 NOTE — Progress Notes (Signed)

## 2024-08-14 ENCOUNTER — Other Ambulatory Visit: Payer: Self-pay

## 2024-08-14 ENCOUNTER — Encounter: Payer: Self-pay | Admitting: Pediatrics

## 2024-08-14 MED ORDER — TERBINAFINE HCL 250 MG PO TABS
250.0000 mg | ORAL_TABLET | Freq: Every day | ORAL | 0 refills | Status: AC
  Filled 2024-08-14 – 2024-09-16 (×2): qty 30, 30d supply, fill #0

## 2024-08-29 ENCOUNTER — Other Ambulatory Visit: Payer: Self-pay

## 2024-08-29 ENCOUNTER — Other Ambulatory Visit (HOSPITAL_COMMUNITY): Payer: Self-pay

## 2024-08-29 MED ORDER — CIPROFLOXACIN HCL 500 MG PO TABS
500.0000 mg | ORAL_TABLET | Freq: Every day | ORAL | 0 refills | Status: AC
  Filled 2024-08-29 (×2): qty 2, 2d supply, fill #0

## 2024-08-30 ENCOUNTER — Encounter: Payer: Self-pay | Admitting: Obstetrics

## 2024-08-30 ENCOUNTER — Ambulatory Visit (INDEPENDENT_AMBULATORY_CARE_PROVIDER_SITE_OTHER): Admitting: Obstetrics

## 2024-08-30 VITALS — BP 125/80 | HR 89

## 2024-08-30 DIAGNOSIS — N393 Stress incontinence (female) (male): Secondary | ICD-10-CM | POA: Diagnosis not present

## 2024-08-30 LAB — POCT URINALYSIS DIP (CLINITEK)
Bilirubin, UA: NEGATIVE
Blood, UA: NEGATIVE
Glucose, UA: NEGATIVE mg/dL
Ketones, POC UA: NEGATIVE mg/dL
Leukocytes, UA: NEGATIVE
Nitrite, UA: NEGATIVE
Spec Grav, UA: >=1.03 — AB (ref 1.010–1.025)
Urobilinogen, UA: 0.2 U/dL
pH, UA: 5.5 (ref 5.0–8.0)

## 2024-08-30 MED ORDER — LIDOCAINE-EPINEPHRINE 1 %-1:100000 IJ SOLN
8.0000 mL | Freq: Once | INTRAMUSCULAR | Status: AC
  Administered 2024-08-30: 8 mL

## 2024-08-30 NOTE — Patient Instructions (Addendum)
 Taking Care of Yourself after Urodynamics, Cystoscopy, Bulkamid Injection, or Botox Injection   Drink plenty of water for a day or two following your procedure. Try to have about 8 ounces (one cup) at a time, and do this 6 times or more per day unless you have fluid restrictitons AVOID irritative beverages such as coffee, tea, soda, alcoholic or citrus drinks for a day or two, as this may cause burning with urination.  For the first 1-2 days after the procedure, your urine may be pink or red in color. You may have some blood in your urine as a normal side effect of the procedure. Large amounts of bleeding or difficulty urinating are NOT normal. Call the nurse line if this happens or go to the nearest Emergency Room if the bleeding is heavy or you cannot urinate at all and it is after hours. If you had a Bulkamid injection in the urethra and need to be catheterized, ask for a pediatric catheter to be used (size 10 or 12-French) so the material is not pushed out of place.   You may experience some discomfort or a burning sensation with urination after having this procedure. You can use over the counter Azo or pyridium to help with burning and follow the instructions on the packaging. If it does not improve within 1-2 days, or other symptoms appear (fever, chills, or difficulty urinating) call the office to speak to a nurse.  You may return to normal daily activities such as work, school, driving, exercising and housework on the day of the procedure. If your doctor gave you a prescription, take it as ordered.     Continue to monitor your weight and association with urinary leakage symptoms.

## 2024-08-30 NOTE — Progress Notes (Signed)
 Bulkamid Injection  CC: 43 y.o. y.o. F with stress incontinence who presents for transurethral Bulkamid injection #2.  Desires repeat injection due to recent increase in leakage, most bothered by leakage during intercourse. Weight gain after reaction of facial weakness due to concomitant use of phentermine  and muscle relaxant use.  Prior 98% improvement from urinary leakage after Bulkamid injection 05/17/24 SUI 1x/week with sneezing, exercises, or during intercourse. Stopped wearing liners less if she is going to exercise Baseline SUI 6x/day   Patient signed her consent form.  She started antibiotic prophylaxis with Ciprofloxacin  today. Currently on her cycle  Today's Vitals   08/30/24 1108  BP: 125/80  Pulse: 89     Results for orders placed or performed in visit on 08/30/24 (from the past 24 hours)  POCT URINALYSIS DIP (CLINITEK)     Status: Abnormal   Collection Time: 08/30/24 11:35 AM  Result Value Ref Range   Color, UA yellow yellow   Clarity, UA clear clear   Glucose, UA negative negative mg/dL   Bilirubin, UA negative negative   Ketones, POC UA negative negative mg/dL   Spec Grav, UA >=8.969 (A) 1.010 - 1.025   Blood, UA negative negative   pH, UA 5.5 5.0 - 8.0   POC PROTEIN,UA trace negative, trace   Urobilinogen, UA 0.2 0.2 or 1.0 E.U./dL   Nitrite, UA Negative Negative   Leukocytes, UA Negative Negative     Procedure: Time out was performed. The bladder was catheterized and 10 ml of 2% lidocaine  jelly placed in the urethra. A urethral block was performed by injecting 3ml of 1% lidocaine  with epinephrine  at 3 and 9 o'clock adjacent to the urethra.  The needle was primed.  The cystoscope was inserted to the level of the bladder neck with right sided urethral cushions findings consistent with prior Bulkamid injection.  The needle was inserted 2 cm and the scope was pulled back into the urethra 2 cm.  The needle was inserted bevel up at the 5 o'clock position and the  Bulkamid was injected to obtain coaptation.  This was repeated at the 2 o'clock,  10 o'clock and 7 o'clock positions.   A total of 2- 1ml syringes were used and good circumferential coaptation was noted.  The patient tolerated the procedure well. She was asked to void after the procedure.  Post-Void Residual (PVR) by Bladder Scan: In order to evaluate bladder emptying, we discussed obtaining a postvoid residual and she agreed to this procedure.  Procedure: The ultrasound unit was placed on the patient's abdomen in the suprapubic region after the patient had voided.     Post Void Residual - 08/30/24 1247       Post Void Residual   Post Void Residual 23 mL            ASSESSMENT: 43 y.o. y.o. s/p transurethral Bulkamid injection for stress incontinence.   PLAN: - Patient will follow up in 4 weeks to reassess. Voiding and post-procedure precautions were given. She will return for heavy bleeding, fevers, dysuria lasting beyond today and incomplete emptying. - encouraged to continue weight reduction  All questions were answered.  Lianne ONEIDA Gillis, MD

## 2024-08-30 NOTE — Addendum Note (Signed)
 Addended by: KRYSTAL ANDREE GAILS on: 08/30/2024 02:41 PM   Modules accepted: Orders

## 2024-09-16 ENCOUNTER — Other Ambulatory Visit: Payer: Self-pay | Admitting: Obstetrics

## 2024-09-16 ENCOUNTER — Other Ambulatory Visit: Payer: Self-pay

## 2024-09-17 ENCOUNTER — Other Ambulatory Visit: Payer: Self-pay

## 2024-09-17 ENCOUNTER — Other Ambulatory Visit: Payer: Self-pay | Admitting: Pediatrics

## 2024-09-17 DIAGNOSIS — N6489 Other specified disorders of breast: Secondary | ICD-10-CM

## 2024-09-17 DIAGNOSIS — Z1231 Encounter for screening mammogram for malignant neoplasm of breast: Secondary | ICD-10-CM

## 2024-09-19 ENCOUNTER — Other Ambulatory Visit: Payer: Self-pay

## 2024-09-19 ENCOUNTER — Other Ambulatory Visit: Payer: Self-pay | Admitting: Family Medicine

## 2024-09-19 DIAGNOSIS — N6489 Other specified disorders of breast: Secondary | ICD-10-CM

## 2024-09-19 DIAGNOSIS — Z1231 Encounter for screening mammogram for malignant neoplasm of breast: Secondary | ICD-10-CM

## 2024-09-22 ENCOUNTER — Other Ambulatory Visit: Payer: Self-pay

## 2024-10-01 ENCOUNTER — Encounter: Payer: Self-pay | Admitting: *Deleted

## 2024-10-02 ENCOUNTER — Inpatient Hospital Stay: Admission: RE | Admit: 2024-10-02 | Source: Ambulatory Visit

## 2024-10-02 ENCOUNTER — Other Ambulatory Visit

## 2024-10-03 ENCOUNTER — Ambulatory Visit: Admitting: Obstetrics

## 2024-10-25 ENCOUNTER — Other Ambulatory Visit: Payer: Self-pay | Admitting: Family Medicine

## 2024-10-25 ENCOUNTER — Ambulatory Visit
Admission: RE | Admit: 2024-10-25 | Discharge: 2024-10-25 | Disposition: A | Source: Ambulatory Visit | Attending: Family Medicine

## 2024-10-25 ENCOUNTER — Telehealth: Payer: Self-pay | Admitting: Pediatrics

## 2024-10-25 DIAGNOSIS — N6489 Other specified disorders of breast: Secondary | ICD-10-CM

## 2024-10-25 DIAGNOSIS — Z1231 Encounter for screening mammogram for malignant neoplasm of breast: Secondary | ICD-10-CM

## 2024-10-25 NOTE — Telephone Encounter (Unsigned)
 Copied from CRM [id number]. Topic: Clinical - Request for Lab/Test Order >> Oct 25, 2024  3:35 PM Harlene ORN wrote: Reason for CRM: [facility] at [facility] Requesting an order for a right order ultrasound Patient is there now for an appointment. Warm transferred call to the office. Phone: [phone]

## 2024-10-26 NOTE — Telephone Encounter (Signed)
 Spoke to [place]. Resolved and handled.
